# Patient Record
Sex: Female | Born: 1995 | Race: Black or African American | Hispanic: No | Marital: Single | Smoking: Never smoker
Health system: Southern US, Community
[De-identification: ages and names within clinical notes are randomized; demographics above are authoritative.]

## PROBLEM LIST (undated history)

## (undated) DIAGNOSIS — R634 Abnormal weight loss: Secondary | ICD-10-CM

## (undated) HISTORY — DX: Abnormal weight loss: R63.4

---
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
## 2018-07-29 ENCOUNTER — Other Ambulatory Visit: Payer: Self-pay

## 2018-07-29 ENCOUNTER — Ambulatory Visit: Payer: 59 | Attending: Family Medicine | Admitting: Physical Therapy

## 2018-07-29 ENCOUNTER — Encounter: Payer: Self-pay | Admitting: Physical Therapy

## 2018-07-29 DIAGNOSIS — G8929 Other chronic pain: Secondary | ICD-10-CM | POA: Diagnosis present

## 2018-07-29 DIAGNOSIS — M6281 Muscle weakness (generalized): Secondary | ICD-10-CM

## 2018-07-29 DIAGNOSIS — M25551 Pain in right hip: Secondary | ICD-10-CM | POA: Insufficient documentation

## 2018-07-29 DIAGNOSIS — M545 Low back pain: Secondary | ICD-10-CM | POA: Insufficient documentation

## 2018-07-29 DIAGNOSIS — M62830 Muscle spasm of back: Secondary | ICD-10-CM | POA: Diagnosis present

## 2018-07-29 NOTE — Therapy (Signed)
[facility] [address] [place], [place], [address] Phone: [phone]   Fax:  [phone]  Physical Therapy Evaluation  Patient Details  Name: Linda Craig MRN: [id number] Date of Birth: 1996-04-20 Referring Provider: Darrow Bussing, MD   Encounter Date: 07/29/2018  PT End of Session - 07/29/18 1029    Visit Number  1    Date for PT Re-Evaluation  09/28/18    Authorization Time Period  07/29/18 to 09/28/18    Authorization - Number of Visits  30    PT Start Time  0847    PT Stop Time  0929    PT Time Calculation (min)  42 min    Activity Tolerance  No increased pain;Patient tolerated treatment well    Behavior During Therapy  [facility] for tasks assessed/performed       Past Medical History:  Diagnosis Date  . Abdominal pain   . Weight loss     History reviewed. No pertinent surgical history.  There were no vitals filed for this visit.   Subjective Assessment - 07/29/18 0854    Subjective  Pt reports that she was doing a lift test back in January, and the following day her back locked up on her. She went to the ED and they diagnosed her with a back spasm and she tried to work through the pain. Again in June, she noticed a popping/pulling in her back and was sent to the urgernt care. From there, she went to PT about a month later for 2x/week for 3 weeks. She feels like it was helping but did start in alot of pain. She was told that her hip was out of alignment.     Limitations  Lifting    Patient Stated Goals  decrease pain in her back     Currently in Pain?  Yes    Pain Score  7     Pain Location  Back    Pain Orientation  Right;Posterior    Pain Descriptors / Indicators  Tightness    Pain Type  Acute pain;Chronic pain    Pain Radiating Towards  Rt posterior/lateral buttock     Pain Onset  More than a month ago    Pain Frequency  Constant    Aggravating Factors   laying on Rt side, any lifting     Pain  Relieving Factors  ice/estim helped temporarily    Multiple Pain Sites  No         [facility] PT Assessment - 07/29/18 0001      Assessment   Medical Diagnosis  chronic low back pain     Referring Provider  Dibas Koirala, MD    Onset Date/Surgical Date  --   End of January    Next MD Visit  08/04/18    Prior Therapy  2x/week for 3 weeks for low back       Precautions   Precautions  None      Restrictions   Weight Bearing Restrictions  No      Balance Screen   Has the patient fallen in the past 6 months  No    Has the patient had a decrease in activity level because of a fear of falling?   No    Is the patient reluctant to leave their home because of a fear of falling?   No      Prior Function   Vocation Requirements  currently MD has her out of work.  Alot of lifting, bending       Cognition   Overall Cognitive Status  Within Functional Limits for tasks assessed      Observation/Other Assessments   Focus on Therapeutic Outcomes (FOTO)   59% limited       Sensation   Additional Comments  Pt reports numbness and pain over Rt posterior/lateral hip       ROM / Strength   AROM / PROM / Strength  AROM;Strength      AROM   AROM Assessment Site  Lumbar    Lumbar Flexion  limited 50% painful stretch     Lumbar Extension  25% limited, pain end range   repeated extension in standing no change x10 reps    Lumbar - Right Rotation  50% limited, pain Rt low back     Lumbar - Left Rotation  25% limited, pain free       Strength   Strength Assessment Site  Hip;Knee    Right/Left Hip  Right;Left    Right Hip Flexion  5/5    Right Hip External Rotation   4/5    Right Hip Internal Rotation  4/5    Right Hip ABduction  3/5   painful   Left Hip Flexion  5/5    Left Hip Extension  5/5    Left Hip External Rotation  5/5    Left Hip Internal Rotation  5/5    Left Hip ABduction  4/5      Flexibility   Soft Tissue Assessment /Muscle Length  yes    Hamstrings  WNL    Quadriceps  WNL       Palpation   Palpation comment  tenderness along Rt glutes, Rt lumbar parspinals, Rt quadratus lumborum       Special Tests   Other special tests  (-) straight leg raise Lt/Rt                 Objective measurements completed on examination: See above findings.      [facility] Adult PT Treatment/Exercise - 07/29/18 0001      Self-Care   Self-Care  Heat/Ice Application    Heat/Ice Application  heat/ice parameters      Exercises   Exercises  Lumbar      Lumbar Exercises: Stretches   Piriformis Stretch  Right;1 rep;30 seconds    Other Lumbar Stretch Exercise  low trunk rotation x5 reps Lt and Rt     Other Lumbar Stretch Exercise  Rt glute stretch 2x15 sec hold       Lumbar Exercises: Supine   Other Supine Lumbar Exercises  Rt glute massage with ball, HEP demo             PT Education - 07/29/18 1028    Education Details  eval findings/POC; muscle spasm and importance of introducing movement; self glute massage at home    Person(s) Educated  Patient    Methods  Explanation;Verbal cues    Comprehension  Verbalized understanding;Returned demonstration       PT Short Term Goals - 07/29/18 1035      PT SHORT TERM GOAL #1   Title  Pt will demo consistency and independence with her initial HEP to improve ROM and decrease pain.     Time  4    Period  Weeks    Status  New    Target Date  08/29/18      PT SHORT TERM GOAL #2   Title  Pt  will demo consistency use and independent set up of lumbar roll to improve seated posture throughout the day.     Time  4    Period  Weeks    Status  New      PT SHORT TERM GOAL #3   Title  Pt will report atleast 30% improvement in her pain from the start of PT, to increase session participation.     Time  4    Period  Weeks    Status  New        PT Long Term Goals - 07/29/18 1037      PT LONG TERM GOAL #1   Title  Pt will demo improved Rt hip strength to 5/5 MMT which will increase her ability to use proper mechanics  while lifting.     Time  8    Period  Weeks    Status  New    Target Date  09/28/18      PT LONG TERM GOAL #2   Title  Pt will be able to lift a 10# box from the floor 3/5 trials without the need for cues for proper technique.     Time  8    Period  Weeks    Status  New      PT LONG TERM GOAL #3   Title  Pt will report atleast 75% improvement in her low back/buttock pain from the start of PT, to allow her to resume regular work activity with activity.     Time  8    Period  Weeks    Status  New      PT LONG TERM GOAL #4   Title  Pt will be able to complete active lumbar ROM with no more than 2/10 discomfort to improve her participation in daily activity.     Time  8    Period  Weeks    Status  New             Plan - 07/29/18 1029    Clinical Impression Statement  Pt is a 22 y.o F referred to OPPT with complaints of Rt low back and lateral hip pain. Her low back pain was onset back in January following a lifting test at work, since then her pain has fluctuated and was worsened again with activity in June. She recently noted Rt lateral hip/buttock pain especially with laying on her Rt side. She demonstrates pain and limitation in lumbar active ROM, as well as limited Rt hip strength and tenderness along the gluteals. She is currently out of work and has been trying to complete stretches provided by her previous PT. She would benefit from skilled PT to address her muscle spasm and improve her lumbar ROM, hip strength and trunk strength/stability as well as education on proper lifting mechanics to facilitate her return to work without limitation.     Clinical Presentation  Stable    Clinical Presentation due to:  somewhat improved since january but recently exacerbated with lifitng in june    Clinical Decision Making  Low    Rehab Potential  Good    PT Frequency  2x / week    PT Duration  8 weeks    PT Treatment/Interventions  ADLs/Self Care Home Management;Moist Heat;Electrical  Stimulation;Cryotherapy;Therapeutic exercise;Patient/family education;Passive range of motion;Manual techniques;Therapeutic activities;Neuromuscular re-education;Dry needling;Taping    PT Next Visit Plan  f/u on prone pressups; introduce trunk strengthening; soft tissue techniques and possible D/N to lumbar and glutes;  promote gentle lumbar movement     PT Home Exercise Plan  Access Code: [email or web address]     Consulted and Agree with Plan of Care  Patient       Patient will benefit from skilled therapeutic intervention in order to improve the following deficits and impairments:  Decreased activity tolerance, Decreased strength, Impaired flexibility, Postural dysfunction, Pain, Improper body mechanics, Decreased range of motion, Increased muscle spasms  Visit Diagnosis: Chronic right-sided low back pain, with sciatica presence unspecified  Pain in right hip  Muscle spasm of back  Muscle weakness (generalized)     Problem List Patient Active Problem List   Diagnosis Date Noted  . Decrease in appetite 07/05/2011  . Epigastric abdominal pain   . Weight loss    10:43 AM,07/29/18 Donita Brooks PT, DPT [facility]  [phone]   [facility] [address] [place], [place], [address] Phone: [phone]   Fax:  [phone]  Name: Linda Craig MRN: [id number] Date of Birth: 1996/11/12

## 2018-07-29 NOTE — Patient Instructions (Signed)
Access Code: [email or web address]  URL: [email or web address]  Date: 07/29/2018  Prepared by: Marylyn IshiharaSara Kiser   Exercises  Supine Gluteus Stretch - 3 reps - 2 sets - 30 hold - 1x daily - 7x weekly  Supine Figure 4 Piriformis Stretch - 3 reps - 30 hold - 1x daily - 7x weekly  Supine Lower Trunk Rotation - 10 reps - 2 sets - 1x daily - 7x weekly    [facility] [address], [address] [place], [place] [address] Phone # [phone] Fax [phone]

## 2018-08-11 ENCOUNTER — Ambulatory Visit: Payer: 59 | Attending: Family Medicine | Admitting: Physical Therapy

## 2018-08-11 ENCOUNTER — Encounter: Payer: Self-pay | Admitting: Physical Therapy

## 2018-08-11 DIAGNOSIS — G8929 Other chronic pain: Secondary | ICD-10-CM | POA: Diagnosis present

## 2018-08-11 DIAGNOSIS — M25551 Pain in right hip: Secondary | ICD-10-CM | POA: Diagnosis present

## 2018-08-11 DIAGNOSIS — M6281 Muscle weakness (generalized): Secondary | ICD-10-CM

## 2018-08-11 DIAGNOSIS — M62830 Muscle spasm of back: Secondary | ICD-10-CM | POA: Insufficient documentation

## 2018-08-11 DIAGNOSIS — M545 Low back pain: Secondary | ICD-10-CM | POA: Diagnosis not present

## 2018-08-11 NOTE — Therapy (Signed)
[facility] [address], [address] [place], [place], [address] Phone: [phone]   Fax:  [phone]  Physical Therapy Treatment  Patient Details  Name: Linda Craig MRN: [id number] Date of Birth: June 24, 1996 Referring Provider: Darrow Bussing, MD   Encounter Date: 08/11/2018  PT End of Session - 08/11/18 1518    Visit Number  2    Date for PT Re-Evaluation  09/28/18    Authorization Time Period  07/29/18 to 09/28/18    Authorization - Number of Visits  30    PT Start Time  1015    PT Stop Time  1105    PT Time Calculation (min)  50 min    Activity Tolerance  No increased pain;Patient tolerated treatment well       Past Medical History:  Diagnosis Date  . Abdominal pain   . Weight loss     History reviewed. No pertinent surgical history.  There were no vitals filed for this visit.  Subjective Assessment - 08/11/18 1017    Subjective  Back pain 8/10.  Always more in the morning.  Right LBP to right lateral hip.  Hurts with lying down.  Just got back from [facility].      Currently in Pain?  Yes    Pain Score  8     Pain Location  Back    Pain Orientation  Right    Pain Type  Chronic pain    Aggravating Factors   mornings;  lying on right side;  prolonged walking     Pain Relieving Factors  ice                        [facility] Adult PT Treatment/Exercise - 08/11/18 0001      Lumbar Exercises: Supine   Ab Set  10 reps    Other Supine Lumbar Exercises  review of initial HEP      Lumbar Exercises: Prone   Other Prone Lumbar Exercises  press ups 10x      Moist Heat Therapy   Number Minutes Moist Heat  13 Minutes    Moist Heat Location  Lumbar Spine;Hip      Electrical Stimulation   Electrical Stimulation Location  lumbar hip    Electrical Stimulation Action  IFC    Electrical Stimulation Parameters  7 ma 13 min prone    Electrical Stimulation Goals  Pain      Manual Therapy   Manual  Therapy  Joint mobilization;Soft tissue mobilization    Joint Mobilization  pelvic distraction/ neutral gapping    Soft tissue mobilization  lumbar paraspinals, QL, gluteals        Trigger Point Dry Needling - 08/11/18 1517    Consent Given?  Yes    Education Handout Provided  Yes    Muscles Treated Lower Body  Gluteus minimus;Gluteus maximus   bil lumbar multifidi   Gluteus Maximus Response  Palpable increased muscle length           PT Education - 08/11/18 1518    Education Details  supine abdominal brace;  home TENs info;  DN after care    Person(s) Educated  Patient    Methods  Explanation;Handout;Demonstration    Comprehension  Returned demonstration;Verbalized understanding       PT Short Term Goals - 07/29/18 1035      PT SHORT TERM GOAL #1   Title  Pt will demo consistency and independence with her  initial HEP to improve ROM and decrease pain.     Time  4    Period  Weeks    Status  New    Target Date  08/29/18      PT SHORT TERM GOAL #2   Title  Pt will demo consistency use and independent set up of lumbar roll to improve seated posture throughout the day.     Time  4    Period  Weeks    Status  New      PT SHORT TERM GOAL #3   Title  Pt will report atleast 30% improvement in her pain from the start of PT, to increase session participation.     Time  4    Period  Weeks    Status  New        PT Long Term Goals - 07/29/18 1037      PT LONG TERM GOAL #1   Title  Pt will demo improved Rt hip strength to 5/5 MMT which will increase her ability to use proper mechanics while lifting.     Time  8    Period  Weeks    Status  New    Target Date  09/28/18      PT LONG TERM GOAL #2   Title  Pt will be able to lift a 10# box from the floor 3/5 trials without the need for cues for proper technique.     Time  8    Period  Weeks    Status  New      PT LONG TERM GOAL #3   Title  Pt will report atleast 75% improvement in her low back/buttock pain from the  start of PT, to allow her to resume regular work activity with activity.     Time  8    Period  Weeks    Status  New      PT LONG TERM GOAL #4   Title  Pt will be able to complete active lumbar ROM with no more than 2/10 discomfort to improve her participation in daily activity.     Time  8    Period  Weeks    Status  New            Plan - 08/11/18 1055    Clinical Impression Statement  The patient demonstrates full lumbar extension with press ups and is generally hypermobile in most other joints.  She has tender points in right lumbar musculature and right gluteals.  She is receptive to manual therapy and initiating dry needling.  Improved soft tissue mobility noted following.  Good response to ES/heat.       Rehab Potential  Good    PT Frequency  2x / week    PT Duration  8 weeks    PT Treatment/Interventions  ADLs/Self Care Home Management;Moist Heat;Electrical Stimulation;Cryotherapy;Therapeutic exercise;Patient/family education;Passive range of motion;Manual techniques;Therapeutic activities;Neuromuscular re-education;Dry needling;Taping    PT Next Visit Plan  assess response to DN #1;  STW lumbar and right hip;  progress abdominal brace series;  add bird dogs;  ES/heat as needed    PT Home Exercise Plan  Access Code: [email or web address]        Patient will benefit from skilled therapeutic intervention in order to improve the following deficits and impairments:  Decreased activity tolerance, Decreased strength, Impaired flexibility, Postural dysfunction, Pain, Improper body mechanics, Decreased range of motion, Increased muscle spasms  Visit Diagnosis: Chronic right-sided low back pain, with  sciatica presence unspecified  Pain in right hip  Muscle spasm of back  Muscle weakness (generalized)     Problem List Patient Active Problem List   Diagnosis Date Noted  . Decrease in appetite 07/05/2011  . Epigastric abdominal pain   . Weight loss    Lavinia Sharps, PT 08/11/18 3:23  PM Phone: [phone] Fax: [phone]  [name] 08/11/2018, 3:23 PM  [facility] [address], [address] [place], [place], [address] Phone: [phone]   Fax:  [phone]  Name: Jaiana Waage MRN: [id number] Date of Birth: August 31, 1996

## 2018-08-11 NOTE — Patient Instructions (Addendum)
TENS UNIT  This is helpful for muscle pain and spasm.   Search and Purchase a TENS 7000 2nd edition at www.tenspros.com or www.amazon.com  (It should be less than $30)     TENS unit instructions:   Do not shower or bathe with the unit on  Turn the unit off before removing electrodes or batteries  If the electrodes lose stickiness add a drop of water to the electrodes after they are disconnected from the unit and place on plastic sheet. If you continued to have difficulty, call the TENS unit company to purchase more electrodes.  Do not apply lotion on the skin area prior to use. Make sure the skin is clean and dry as this will help prolong the life of the electrodes.  After use, always check skin for unusual red areas, rash or other skin difficulties. If there are any skin problems, does not apply electrodes to the same area.  Never remove the electrodes from the unit by pulling the wires.  Do not use the TENS unit or electrodes other than as directed.  Do not change electrode placement without consulting your therapist or physician.  Keep 2 fingers with between each electrode.   Trigger Point Dry Needling  . What is Trigger Point Dry Needling (DN)? o DN is a physical therapy technique used to treat muscle pain and dysfunction. Specifically, DN helps deactivate muscle trigger points (muscle knots).  o A thin filiform needle is used to penetrate the skin and stimulate the underlying trigger point. The goal is for a local twitch response (LTR) to occur and for the trigger point to relax. No medication of any kind is injected during the procedure.   . What Does Trigger Point Dry Needling Feel Like?  o The procedure feels different for each individual patient. Some patients report that they do not actually feel the needle enter the skin and overall the process is not painful. Very mild bleeding may occur. However, many patients feel a deep cramping in the muscle in which the needle was  inserted. This is the local twitch response.   Marland Kitchen How Will I feel after the treatment? o Soreness is normal, and the onset of soreness may not occur for a few hours. Typically this soreness does not last longer than two days.  o Bruising is uncommon, however; ice can be used to decrease any possible bruising.  o In rare cases feeling tired or nauseous after the treatment is normal. In addition, your symptoms may get worse before they get better, this period will typically not last longer than 24 hours.   . What Can I do After My Treatment? o Increase your hydration by drinking more water for the next 24 hours. o You may place ice or heat on the areas treated that have become sore, however, do not use heat on inflamed or bruised areas. Heat often brings more relief post needling. o You can continue your regular activities, but vigorous activity is not recommended initially after the treatment for 24 hours. o DN is best combined with other physical therapy such as strengthening, stretching, and other therapies.    Lavinia Sharps PT [facility] [address], [address] [place], [place] [address] Phone # [phone] Fax [phone]

## 2018-08-13 ENCOUNTER — Ambulatory Visit: Payer: 59 | Admitting: Physical Therapy

## 2018-08-13 ENCOUNTER — Encounter: Payer: Self-pay | Admitting: Physical Therapy

## 2018-08-13 DIAGNOSIS — M25551 Pain in right hip: Secondary | ICD-10-CM

## 2018-08-13 DIAGNOSIS — M62830 Muscle spasm of back: Secondary | ICD-10-CM

## 2018-08-13 DIAGNOSIS — G8929 Other chronic pain: Secondary | ICD-10-CM

## 2018-08-13 DIAGNOSIS — M545 Low back pain: Principal | ICD-10-CM

## 2018-08-13 DIAGNOSIS — M6281 Muscle weakness (generalized): Secondary | ICD-10-CM

## 2018-08-13 NOTE — Patient Instructions (Addendum)
     Access Code: [email or web address]  URL: [email or web address]  Date: 08/13/2018  Prepared by: Lavinia SharpsStacy Sully Manzi   Exercises  Supine Gluteus Stretch - 3 reps - 2 sets - 30 hold - 1x daily - 7x weekly  Supine Figure 4 Piriformis Stretch - 3 reps - 30 hold - 1x daily - 7x weekly  Supine Lower Trunk Rotation - 10 reps - 2 sets - 1x daily - 7x weekly  Supine Transversus Abdominis Bracing - Hands on Stomach - 10 reps - 1 sets - 1x daily - 7x weekly  Hooklying Isometric Clamshell - 10 reps - 2 sets - 1x daily - 7x weekly  Hooklying Isometric Hip Flexion - 10 reps - 1 sets - 1x daily - 7x weekly  Bird Dog - 10 reps - 1 sets - 1x daily - 7x weekly  Cat Cow - 10 reps - 1 sets - 1x daily - 7x weekly     Lavinia SharpsStacy Kerilyn Cortner PT [facility] [address], [address] [place], [place] [address] Phone # [phone] Fax [phone]

## 2018-08-13 NOTE — Therapy (Signed)
[facility] [address] [place], [place], [address] Phone: [phone]   Fax:  [phone]  Physical Therapy Treatment  Patient Details  Name: Linda Craig MRN: [id number] Date of Birth: 30-Apr-1996 Referring Provider: Darrow Bussing, MD   Encounter Date: 08/13/2018  PT End of Session - 08/13/18 1922    Visit Number  3    Date for PT Re-Evaluation  09/28/18    Authorization Time Period  07/29/18 to 09/28/18    PT Start Time  0930    PT Stop Time  1015    PT Time Calculation (min)  45 min    Activity Tolerance  Patient tolerated treatment well       Past Medical History:  Diagnosis Date  . Abdominal pain   . Weight loss     History reviewed. No pertinent surgical history.  There were no vitals filed for this visit.  Subjective Assessment - 08/13/18 0929    Subjective  I'm sore from the DN.  7/10 this morning.      Currently in Pain?  Yes    Pain Score  7     Pain Location  Back    Pain Orientation  Lower    Pain Type  Chronic pain                       [facility] Adult PT Treatment/Exercise - 08/13/18 0001      Lumbar Exercises: Aerobic   UBE (Upper Arm Bike)  sitting on green ball 1 1/2 forward, 1 1/2 backward      Lumbar Exercises: Seated   Other Seated Lumbar Exercises  pelvic rocks sitting on ball 10x      Lumbar Exercises: Supine   Ab Set  10 reps    Clam  10 reps    Bent Knee Raise Limitations  attempted but discontinued secondary to pain     Isometric Hip Flexion  10 reps      Lumbar Exercises: Quadruped   Single Arm Raise  Right;Left;5 reps    Straight Leg Raise  5 reps    Opposite Arm/Leg Raise  Right arm/Left leg;Left arm/Right leg;5 reps      Manual Therapy   Kinesiotex  Facilitate Muscle      Kinesiotix   Facilitate Muscle   horizontal across low back, 2 strips hip             PT Education - 08/13/18 1008    Education Details   Access Code: [email or web address]   cat/cow;  supine clams;  bird dogs    Person(s) Educated  Patient    Methods  Explanation;Demonstration;Handout    Comprehension  Returned demonstration;Verbalized understanding       PT Short Term Goals - 07/29/18 1035      PT SHORT TERM GOAL #1   Title  Pt will demo consistency and independence with her initial HEP to improve ROM and decrease pain.     Time  4    Period  Weeks    Status  New    Target Date  08/29/18      PT SHORT TERM GOAL #2   Title  Pt will demo consistency use and independent set up of lumbar roll to improve seated posture throughout the day.     Time  4    Period  Weeks    Status  New      PT SHORT TERM GOAL #3  Title  Pt will report atleast 30% improvement in her pain from the start of PT, to increase session participation.     Time  4    Period  Weeks    Status  New        PT Long Term Goals - 07/29/18 1037      PT LONG TERM GOAL #1   Title  Pt will demo improved Rt hip strength to 5/5 MMT which will increase her ability to use proper mechanics while lifting.     Time  8    Period  Weeks    Status  New    Target Date  09/28/18      PT LONG TERM GOAL #2   Title  Pt will be able to lift a 10# box from the floor 3/5 trials without the need for cues for proper technique.     Time  8    Period  Weeks    Status  New      PT LONG TERM GOAL #3   Title  Pt will report atleast 75% improvement in her low back/buttock pain from the start of PT, to allow her to resume regular work activity with activity.     Time  8    Period  Weeks    Status  New      PT LONG TERM GOAL #4   Title  Pt will be able to complete active lumbar ROM with no more than 2/10 discomfort to improve her participation in daily activity.     Time  8    Period  Weeks    Status  New            Plan - 08/13/18 1003    Clinical Impression Statement  The patient reports moderate soreness today but is able to perform low to moderate intensity core stabilization and  strengthening with some modifications secondary to increased pain.  Increased soreness may be attributed to dry needling last visit and should subside in the next day or two.  Verbal and tactile cues to activate transverse abdominus muscles and to avoid pelvic drop in quadruped position.  Continue with treatment plan for pain relieving modalities, manual therapy and exercise.        Rehab Potential  Good    PT Frequency  2x / week    PT Duration  8 weeks    PT Treatment/Interventions  ADLs/Self Care Home Management;Moist Heat;Electrical Stimulation;Cryotherapy;Therapeutic exercise;Patient/family education;Passive range of motion;Manual techniques;Therapeutic activities;Neuromuscular re-education;Dry needling;Taping    PT Next Visit Plan  assess response to KT;  DN #2 if patient found beneficial;    STW lumbar and right hip;  progress abdominal brace series;  add bird dogs;  ES/heat as needed    PT Home Exercise Plan  Access Code: [email or web address]        Patient will benefit from skilled therapeutic intervention in order to improve the following deficits and impairments:  Decreased activity tolerance, Decreased strength, Impaired flexibility, Postural dysfunction, Pain, Improper body mechanics, Decreased range of motion, Increased muscle spasms  Visit Diagnosis: Chronic right-sided low back pain, with sciatica presence unspecified  Pain in right hip  Muscle spasm of back  Muscle weakness (generalized)     Problem List Patient Active Problem List   Diagnosis Date Noted  . Decrease in appetite 07/05/2011  . Epigastric abdominal pain   . Weight loss    Lavinia SharpsStacy Alok Minshall, PT 08/13/18 7:32 PM Phone: [phone] Fax: [phone]  Lavinia SharpsSimpson, Syrita Dovel  C 08/13/2018, 7:32 PM  [facility] [address], [address] [place], [place], [address] Phone: [phone]   Fax:  [phone]  Name: Linda Craig MRN: [id number] Date of Birth: Jan 15, 1996

## 2018-08-18 ENCOUNTER — Encounter: Payer: Self-pay | Admitting: Physical Therapy

## 2018-08-18 ENCOUNTER — Ambulatory Visit: Payer: 59 | Admitting: Physical Therapy

## 2018-08-18 DIAGNOSIS — M25551 Pain in right hip: Secondary | ICD-10-CM

## 2018-08-18 DIAGNOSIS — M545 Low back pain: Principal | ICD-10-CM

## 2018-08-18 DIAGNOSIS — G8929 Other chronic pain: Secondary | ICD-10-CM

## 2018-08-18 DIAGNOSIS — M6281 Muscle weakness (generalized): Secondary | ICD-10-CM

## 2018-08-18 DIAGNOSIS — M62830 Muscle spasm of back: Secondary | ICD-10-CM

## 2018-08-18 NOTE — Therapy (Signed)
[facility] [address] [place], [place], [address] Phone: [phone]   Fax:  [phone]  Physical Therapy Treatment  Patient Details  Name: Linda SpanDareyon Vanwieren MRN: [id number] Date of Birth: 1996/01/05 Referring Provider: Darrow Bussingibas Koirala, MD   Encounter Date: 08/18/2018  PT End of Session - 08/18/18 1923    Visit Number  4    Date for PT Re-Evaluation  09/28/18    Authorization Time Period  07/29/18 to 09/28/18    PT Start Time  0933    PT Stop Time  1020    PT Time Calculation (min)  47 min    Activity Tolerance  Patient tolerated treatment well       Past Medical History:  Diagnosis Date  . Abdominal pain   . Weight loss     History reviewed. No pertinent surgical history.  There were no vitals filed for this visit.  Subjective Assessment - 08/18/18 0938    Subjective  The doctor wrote me out of work for another week.  Going to see a back specialist.  The pain was sharp yesterday.      Currently in Pain?  Yes    Pain Score  7     Pain Location  Back    Pain Orientation  Lower;Right    Pain Type  Chronic pain    Pain Radiating Towards  right buttock/lateral     Pain Frequency  Constant    Aggravating Factors   mornings    Pain Relieving Factors  tape                       [facility] Adult PT Treatment/Exercise - 08/18/18 0001      Lumbar Exercises: Aerobic   UBE (Upper Arm Bike)  sitting on green ball 1 1/2 forward, 1 1/2 backward      Lumbar Exercises: Supine   Other Supine Lumbar Exercises  decompression series 5x  each:  head press, shoulder press, palm press, leg lengthener, whole leg press down.  deep breathing with palms up       Moist Heat Therapy   Number Minutes Moist Heat  13 Minutes    Moist Heat Location  Lumbar Spine;Hip      Electrical Stimulation   Electrical Stimulation Location  lumbar hip    Electrical Stimulation Action  IFC    Electrical Stimulation Parameters  7  ma 13 min prone    Electrical Stimulation Goals  Pain      Manual Therapy   Manual therapy comments  piriformis contract relax 3x 5 sec hold    Joint Mobilization  right hip distraction, inferior, AP in internal rotation grade 3 3x 30 sec    Kinesiotex  Facilitate Muscle      Kinesiotix   Facilitate Muscle   horizontal across low back, 2 strips hip             PT Education - 08/18/18 0954    Education Details  decompression series     Person(s) Educated  Patient    Methods  Explanation;Handout    Comprehension  Returned demonstration;Verbalized understanding       PT Short Term Goals - 07/29/18 1035      PT SHORT TERM GOAL #1   Title  Pt will demo consistency and independence with her initial HEP to improve ROM and decrease pain.     Time  4    Period  Weeks  Status  New    Target Date  08/29/18      PT SHORT TERM GOAL #2   Title  Pt will demo consistency use and independent set up of lumbar roll to improve seated posture throughout the day.     Time  4    Period  Weeks    Status  New      PT SHORT TERM GOAL #3   Title  Pt will report atleast 30% improvement in her pain from the start of PT, to increase session participation.     Time  4    Period  Weeks    Status  New        PT Long Term Goals - 07/29/18 1037      PT LONG TERM GOAL #1   Title  Pt will demo improved Rt hip strength to 5/5 MMT which will increase her ability to use proper mechanics while lifting.     Time  8    Period  Weeks    Status  New    Target Date  09/28/18      PT LONG TERM GOAL #2   Title  Pt will be able to lift a 10# box from the floor 3/5 trials without the need for cues for proper technique.     Time  8    Period  Weeks    Status  New      PT LONG TERM GOAL #3   Title  Pt will report atleast 75% improvement in her low back/buttock pain from the start of PT, to allow her to resume regular work activity with activity.     Time  8    Period  Weeks    Status  New       PT LONG TERM GOAL #4   Title  Pt will be able to complete active lumbar ROM with no more than 2/10 discomfort to improve her participation in daily activity.     Time  8    Period  Weeks    Status  New            Plan - 08/18/18 1924    Clinical Impression Statement  The patient reports continued soreness although it is unclear if this is attributed to the dry needling or the same type pain she has had the last few months.  She is able to perform low level supine decompressive ex's with minimal pain behaviors.  Good response to kinesiotaping and modalities.  Therapist closely monitoring response and modifying treatment accordingly.      Rehab Potential  Good    PT Frequency  2x / week    PT Duration  8 weeks    PT Treatment/Interventions  ADLs/Self Care Home Management;Moist Heat;Electrical Stimulation;Cryotherapy;Therapeutic exercise;Patient/family education;Passive range of motion;Manual techniques;Therapeutic activities;Neuromuscular re-education;Dry needling;Taping    PT Next Visit Plan  assess response to decompressive ex and hip joint mobs;   KT;  DN #2 if patient found beneficial;    STW lumbar and right hip;  progress abdominal brace series;  add bird dogs;  ES/heat as needed       Patient will benefit from skilled therapeutic intervention in order to improve the following deficits and impairments:  Decreased activity tolerance, Decreased strength, Impaired flexibility, Postural dysfunction, Pain, Improper body mechanics, Decreased range of motion, Increased muscle spasms  Visit Diagnosis: Chronic right-sided low back pain, with sciatica presence unspecified  Pain in right hip  Muscle spasm of back  Muscle weakness (generalized)     Problem List Patient Active Problem List   Diagnosis Date Noted  . Decrease in appetite 07/05/2011  . Epigastric abdominal pain   . Weight loss    Lavinia Sharps, PT 08/18/18 7:33 PM Phone: [phone] Fax: [phone]  [name] 08/18/2018, 7:32 PM  [facility] [address] [place], [place], [address] Phone: [phone]   Fax:  [phone]  Name: Amaree Loisel MRN: [id number] Date of Birth: 09-15-1996

## 2018-08-18 NOTE — Patient Instructions (Signed)
   RE-ALIGNMENT ROUTINE EXERCISES Decompression BASIC FOR POSTURAL CORRECTION   RE-ALIGNMENT Tips BENEFITS: 1.It helps to re-align the curves of the back and improve standing posture. 2.It allows the back muscles to rest and strengthen in preparation for more activity. FREQUENCY: Daily, even after weeks, months and years of more advanced exercises. START: 1.All exercises start in the same position: lying on the back, arms resting on the supporting surface, palms up and slightly away from the body, backs of hands down, knees bent, feet flat. 2.The head, neck, arms, and legs are supported according to specific instructions of your therapist. Copyright  VHI. All rights reserved.    1. Decompression Exercise: Basic.   Takes compression off the vertebral bodies; increases tolerance for lying on the back; helps relieve back pain   Lie on back on firm surface, knees bent, feet flat, arms turned up, out to sides (~35 degrees). Head neck and arms supported as necessary. Time _5-15__ minutes. Surface: floor     2. Shoulder Press  Strengthens upper back extensors and scapular retractors.   Press both shoulders down. Hold _2-3__ seconds. Repeat _3-5__ times. Surface: floor        3. Head Press With Chin Tuck  Strengthens neck extensors   Tuck chin SLIGHTLY toward chest, keep mouth closed. Feel weight on back of head. Increase weight by pressing head down. Hold _2-3__ seconds. Relax. Repeat 3-5___ times. Surface: floor   4. Leg Lengthener: Keep one knee bent and the other leg extended.   "Grow your leg longer"   One leg at a time Hold 5 sec 5x   5. Whole leg press "down into the sand. " Other leg is bent.       Lavinia SharpsStacy Tavone Caesar PT [facility] [address], [address] [place], [place] [address] Phone # [phone] Fax [phone]

## 2018-08-20 ENCOUNTER — Encounter: Payer: Self-pay | Admitting: Physical Therapy

## 2018-08-20 ENCOUNTER — Ambulatory Visit: Payer: 59 | Admitting: Physical Therapy

## 2018-08-20 DIAGNOSIS — G8929 Other chronic pain: Secondary | ICD-10-CM

## 2018-08-20 DIAGNOSIS — M545 Low back pain: Secondary | ICD-10-CM | POA: Diagnosis not present

## 2018-08-20 DIAGNOSIS — M6281 Muscle weakness (generalized): Secondary | ICD-10-CM

## 2018-08-20 DIAGNOSIS — M25551 Pain in right hip: Secondary | ICD-10-CM

## 2018-08-20 DIAGNOSIS — M62830 Muscle spasm of back: Secondary | ICD-10-CM

## 2018-08-20 NOTE — Therapy (Signed)
[facility] [address], [address] [place], [place], [address] Phone: [phone]   Fax:  [phone]  Physical Therapy Treatment  Patient Details  Name: Linda Craig MRN: [id number] Date of Birth: Jul 03, 1996 Referring Provider: Darrow Bussing, MD   Encounter Date: 08/20/2018  PT End of Session - 08/20/18 1029    Visit Number  5    Date for PT Re-Evaluation  09/28/18    Authorization Time Period  07/29/18 to 09/28/18    PT Start Time  1015    PT Stop Time  1100    PT Time Calculation (min)  45 min    Activity Tolerance  Patient tolerated treatment well       Past Medical History:  Diagnosis Date  . Abdominal pain   . Weight loss     History reviewed. No pertinent surgical history.  There were no vitals filed for this visit.  Subjective Assessment - 08/20/18 1015    Subjective  Feeling OK.  Nothing new.  My pain is not so bad today.  My abdominal muscles are sore.      Patient Stated Goals  decrease pain in her back     Currently in Pain?  Yes    Pain Score  6     Pain Location  Back    Pain Orientation  Right;Lower    Pain Type  Chronic pain    Pain Onset  More than a month ago    Pain Frequency  Constant    Aggravating Factors   mornings    Pain Relieving Factors  tape                       [facility] Adult PT Treatment/Exercise - 08/20/18 0001      Lumbar Exercises: Aerobic   UBE (Upper Arm Bike)  sitting on green ball 2 1/2 forward, 2 1/2 back      Lumbar Exercises: Supine   Other Supine Lumbar Exercises  decompression series 5x  each:  head press, shoulder press, palm press, leg lengthener, whole leg press down.  deep breathing with palms up       Moist Heat Therapy   Number Minutes Moist Heat  13 Minutes    Moist Heat Location  Lumbar Spine;Hip      Electrical Stimulation   Electrical Stimulation Location  lumbar hip    Electrical Stimulation Action  IFC    Electrical Stimulation  Parameters  7 ma 13 min prone    Electrical Stimulation Goals  Pain      Manual Therapy   Joint Mobilization    Soft tissue mobilization  lumbar paraspinals, QL, gluteals, piriformis    Kinesiotex  Facilitate Muscle   star pattern      Trigger Point Dry Needling - 08/20/18 1019    Consent Given?  Yes    Muscles Treated Lower Body  Piriformis   bil lumbar multifidi    Gluteus Maximus Response  Twitch response elicited;Palpable increased muscle length    Piriformis Response  Twitch response elicited;Palpable increased muscle length             PT Short Term Goals - 07/29/18 1035      PT SHORT TERM GOAL #1   Title  Pt will demo consistency and independence with her initial HEP to improve ROM and decrease pain.     Time  4    Period  Weeks    Status  New  Target Date  08/29/18      PT SHORT TERM GOAL #2   Title  Pt will demo consistency use and independent set up of lumbar roll to improve seated posture throughout the day.     Time  4    Period  Weeks    Status  New      PT SHORT TERM GOAL #3   Title  Pt will report atleast 30% improvement in her pain from the start of PT, to increase session participation.     Time  4    Period  Weeks    Status  New        PT Long Term Goals - 07/29/18 1037      PT LONG TERM GOAL #1   Title  Pt will demo improved Rt hip strength to 5/5 MMT which will increase her ability to use proper mechanics while lifting.     Time  8    Period  Weeks    Status  New    Target Date  09/28/18      PT LONG TERM GOAL #2   Title  Pt will be able to lift a 10# box from the floor 3/5 trials without the need for cues for proper technique.     Time  8    Period  Weeks    Status  New      PT LONG TERM GOAL #3   Title  Pt will report atleast 75% improvement in her low back/buttock pain from the start of PT, to allow her to resume regular work activity with activity.     Time  8    Period  Weeks    Status  New      PT LONG TERM GOAL #4    Title  Pt will be able to complete active lumbar ROM with no more than 2/10 discomfort to improve her participation in daily activity.     Time  8    Period  Weeks    Status  New            Plan - 08/20/18 1054    Clinical Impression Statement  The patient has fewer tender points in gluteals and piriformis compared to previous sessions.  Tender points mainly in right lumbar paraspinals.   She has difficulty performing even very low level exercises with reports of 6/10 pain and sometimes "sharper'".   She does move well with getting on/off treatment tables.   DN seems less painful today and improved soft tissue mobility noted following DN and manual therapy.  She likes the support given by the kinesiotape.  Therapist closely monitoring response to all treatment interventions.      Rehab Potential  Good    PT Frequency  2x / week    PT Duration  8 weeks    PT Treatment/Interventions  ADLs/Self Care Home Management;Moist Heat;Electrical Stimulation;Cryotherapy;Therapeutic exercise;Patient/family education;Passive range of motion;Manual techniques;Therapeutic activities;Neuromuscular re-education;Dry needling;Taping    PT Next Visit Plan  assess response to decompressive ex and hip joint mobs;   star pattern KT;  DN #3 if patient found beneficial; may add ES to DN;     STW lumbar and right hip;  progress abdominal brace series;  bird dogs;  ES/heat as needed    PT Home Exercise Plan  Access Code: [email or web address]        Patient will benefit from skilled therapeutic intervention in order to improve the following deficits and impairments:  Decreased  activity tolerance, Decreased strength, Impaired flexibility, Postural dysfunction, Pain, Improper body mechanics, Decreased range of motion, Increased muscle spasms  Visit Diagnosis: Chronic right-sided low back pain, with sciatica presence unspecified  Pain in right hip  Muscle spasm of back  Muscle weakness (generalized)     Problem  List Patient Active Problem List   Diagnosis Date Noted  . Decrease in appetite 07/05/2011  . Epigastric abdominal pain   . Weight loss    Lavinia Sharps, PT 08/20/18 12:02 PM Phone: [phone] Fax: [phone] [name] 08/20/2018, 12:02 PM  [facility] [address] [place], [place], [address] Phone: [phone]   Fax:  [phone]  Name: Linda Craig MRN: [id number] Date of Birth: 12-Nov-1996

## 2018-08-25 ENCOUNTER — Encounter: Payer: 59 | Admitting: Physical Therapy

## 2018-08-27 ENCOUNTER — Ambulatory Visit: Payer: 59 | Admitting: Physical Therapy

## 2018-09-01 ENCOUNTER — Ambulatory Visit: Payer: 59 | Attending: Family Medicine | Admitting: Physical Therapy

## 2018-09-01 DIAGNOSIS — M6281 Muscle weakness (generalized): Secondary | ICD-10-CM | POA: Insufficient documentation

## 2018-09-01 DIAGNOSIS — M25551 Pain in right hip: Secondary | ICD-10-CM | POA: Insufficient documentation

## 2018-09-01 DIAGNOSIS — M62830 Muscle spasm of back: Secondary | ICD-10-CM | POA: Diagnosis present

## 2018-09-01 NOTE — Therapy (Signed)
[facility] [address], [place], [place], [address] Phone: [phone]   Fax:  [phone]  Physical Therapy Treatment  Patient Details  Name: Linda Craig MRN: [id number] Date of Birth: 15-Mar-1996 Referring Provider (PT): Dibas Dorthy Cooler, MD   Encounter Date: 09/01/2018  PT End of Session - 09/01/18 1046    Visit Number  6    Date for PT Re-Evaluation  09/28/18    Authorization Time Period  07/29/18 to 09/28/18    Authorization - Number of Visits  30    PT Start Time  1014    PT Stop Time  1100    PT Time Calculation (min)  46 min    Activity Tolerance  Patient tolerated treatment well       Past Medical History:  Diagnosis Date  . Abdominal pain   . Weight loss     No past surgical history on file.  There were no vitals filed for this visit.  Subjective Assessment - 09/01/18 1014    Subjective  Saw the back specialist on Friday.  Going for an MRI and then ESI.  Not scheduled yet.  She reports she is very needle-phobic.    Lower back pain.  I was sore after DN last time but over it now.  60-70% better.      Limitations  Lifting    Patient Stated Goals  decrease pain in her back     Currently in Pain?  Yes    Pain Score  6     Pain Location  Back    Pain Orientation  Lower    Pain Type  Chronic pain    Aggravating Factors   right sidelying;  sitting erect; standing for a long time    Pain Relieving Factors  kinesiotape; IFC/heat; lying on my back;  walking OK for short distances to mailbox                       [facility] Adult PT Treatment/Exercise - 09/01/18 0001      Lumbar Exercises: Aerobic   UBE (Upper Arm Bike)  L2 sitting on green ball 2  forward, 2  back      Lumbar Exercises: Supine   Ab Set  10 reps    Isometric Hip Flexion  10 reps      Lumbar Exercises: Sidelying   Other Sidelying Lumbar Exercises  open books on right 10x      Lumbar Exercises: Quadruped   Other  Quadruped Lumbar Exercises  tall kneel rocks 10x       Moist Heat Therapy   Number Minutes Moist Heat  13 Minutes    Moist Heat Location  Lumbar Spine;Hip      Electrical Stimulation   Electrical Stimulation Location  lumbar hip    Electrical Stimulation Action  IFC    Electrical Stimulation Parameters  8 ma 15 min prone    Electrical Stimulation Goals  Pain      Manual Therapy   Kinesiotex  Facilitate Muscle   star pattern              PT Short Term Goals - 09/01/18 1046      PT SHORT TERM GOAL #1   Title  Pt will demo consistency and independence with her initial HEP to improve ROM and decrease pain.     Status  Achieved      PT SHORT TERM GOAL #2   Title  Pt will demo consistency use and independent set up of lumbar roll to improve seated posture throughout the day.     Status  Achieved      PT SHORT TERM GOAL #3   Title  Pt will report atleast 30% improvement in her pain from the start of PT, to increase session participation.     Time  4    Period  Weeks    Status  Achieved        PT Long Term Goals - 07/29/18 1037      PT LONG TERM GOAL #1   Title  Pt will demo improved Rt hip strength to 5/5 MMT which will increase her ability to use proper mechanics while lifting.     Time  8    Period  Weeks    Status  New    Target Date  09/28/18      PT LONG TERM GOAL #2   Title  Pt will be able to lift a 10# box from the floor 3/5 trials without the need for cues for proper technique.     Time  8    Period  Weeks    Status  New      PT LONG TERM GOAL #3   Title  Pt will report atleast 75% improvement in her low back/buttock pain from the start of PT, to allow her to resume regular work activity with activity.     Time  8    Period  Weeks    Status  New      PT LONG TERM GOAL #4   Title  Pt will be able to complete active lumbar ROM with no more than 2/10 discomfort to improve her participation in daily activity.     Time  8    Period  Weeks    Status   New            Plan - 09/01/18 1047    Clinical Impression Statement  The patient expresses concern about proposed ESI secondary to fear of needles.  Her pain score continues to be at a moderate level but rates her overall improvement at 60-70%.  All STGs met.  She is needs frequent modification of exercises secondary to complaints of pain but tolerates sitting, left sidelying and tall kneeling fairly well.  Good pain relief with ES/heat and kinesiotaping.      Rehab Potential  Good    PT Frequency  2x / week    PT Duration  8 weeks    PT Treatment/Interventions  ADLs/Self Care Home Management;Moist Heat;Electrical Stimulation;Cryotherapy;Therapeutic exercise;Patient/family education;Passive range of motion;Manual techniques;Therapeutic activities;Neuromuscular re-education;Dry needling;Taping    PT Next Visit Plan  Do FOTO next visit;  low level core stabilization;  functional reactivation;  ES/heat;  KT    PT Home Exercise Plan  Access Code: [email or web address]        Patient will benefit from skilled therapeutic intervention in order to improve the following deficits and impairments:  Decreased activity tolerance, Decreased strength, Impaired flexibility, Postural dysfunction, Pain, Improper body mechanics, Decreased range of motion, Increased muscle spasms  Visit Diagnosis: Pain in right hip  Muscle spasm of back  Muscle weakness (generalized)     Problem List Patient Active Problem List   Diagnosis Date Noted  . Decrease in appetite 07/05/2011  . Epigastric abdominal pain   . Weight loss    [name], PT 09/01/18 12:03 PM Phone: [phone] Fax: [phone]  [name] 09/01/2018,  11:59 AM  [facility] [address] [place], [place], [address] Phone: [phone]   Fax:  [phone]  Name: Linda Craig MRN: [id number] Date of Birth: May 18, 1996

## 2018-09-03 ENCOUNTER — Other Ambulatory Visit: Payer: Self-pay | Admitting: Orthopedic Surgery

## 2018-09-03 ENCOUNTER — Encounter: Payer: Self-pay | Admitting: Physical Therapy

## 2018-09-03 ENCOUNTER — Ambulatory Visit: Payer: 59 | Admitting: Physical Therapy

## 2018-09-03 DIAGNOSIS — M25551 Pain in right hip: Secondary | ICD-10-CM | POA: Diagnosis not present

## 2018-09-03 DIAGNOSIS — M545 Low back pain, unspecified: Secondary | ICD-10-CM

## 2018-09-03 DIAGNOSIS — M6281 Muscle weakness (generalized): Secondary | ICD-10-CM

## 2018-09-03 DIAGNOSIS — M62830 Muscle spasm of back: Secondary | ICD-10-CM

## 2018-09-03 DIAGNOSIS — G8929 Other chronic pain: Secondary | ICD-10-CM

## 2018-09-03 NOTE — Therapy (Signed)
[facility] [address] [place], [place], [address] Phone: [phone]   Fax:  [phone]  Physical Therapy Treatment  Patient Details  Name: Linda Craig MRN: [id number] Date of Birth: Apr 20, 1996 Referring Provider (PT): Dibas Docia Chuck, MD   Encounter Date: 09/03/2018  PT End of Session - 09/03/18 1055    Visit Number  7    Date for PT Re-Evaluation  09/28/18    Authorization Time Period  07/29/18 to 09/28/18    Authorization - Number of Visits  30    PT Start Time  1014    PT Stop Time  1105    PT Time Calculation (min)  51 min    Activity Tolerance  Patient tolerated treatment well       Past Medical History:  Diagnosis Date  . Abdominal pain   . Weight loss     History reviewed. No pertinent surgical history.  There were no vitals filed for this visit.  Subjective Assessment - 09/03/18 1014    Subjective  Waiting for call to schedule MRI.  Had numbness in hip while riding in the car but was able to walk it out.      Currently in Pain?  No/denies    Pain Score  0-No pain         [facility] PT Assessment - 09/03/18 0001      Observation/Other Assessments   Focus on Therapeutic Outcomes (FOTO)   52% limitation                    [facility] Adult PT Treatment/Exercise - 09/03/18 0001      Lumbar Exercises: Stretches   Other Lumbar Stretch Exercise  foam roll childs pose    Other Lumbar Stretch Exercise  foam roll pretzel 5x right and left       Lumbar Exercises: Aerobic   Nustep  L1 8 min       Lumbar Exercises: Standing   Other Standing Lumbar Exercises  Pallof series red band 5x each SLS       Lumbar Exercises: Sidelying   Clam  Right;10 reps    Other Sidelying Lumbar Exercises  open books on right 10x      Lumbar Exercises: Quadruped   Other Quadruped Lumbar Exercises  tall kneel rocks 10x     Other Quadruped Lumbar Exercises  Charl[employer] arms, bil UE reach; red band        Cryotherapy   Number Minutes Cryotherapy  13 Minutes    Cryotherapy Location  Lumbar Spine    Type of Cryotherapy  Ice pack      Electrical Stimulation   Electrical Stimulation Location  lumbar hip    Electrical Stimulation Action  IFC    Electrical Stimulation Parameters  8 ma 13 min prone    Electrical Stimulation Goals  Pain      Kinesiotix   Facilitate Muscle   star pattern lumbar             PT Education - 09/03/18 1228    Education Details   Access Code: [email or web address] Pallof series; 1/2 kneel;  quadruped thread the needle    Person(s) Educated  Patient    Methods  Explanation;Demonstration;Handout    Comprehension  Returned demonstration;Verbalized understanding       PT Short Term Goals - 09/01/18 1046      PT SHORT TERM GOAL #1   Title  Pt will demo consistency and independence with her  initial HEP to improve ROM and decrease pain.     Status  Achieved      PT SHORT TERM GOAL #2   Title  Pt will demo consistency use and independent set up of lumbar roll to improve seated posture throughout the day.     Status  Achieved      PT SHORT TERM GOAL #3   Title  Pt will report atleast 30% improvement in her pain from the start of PT, to increase session participation.     Time  4    Period  Weeks    Status  Achieved        PT Long Term Goals - 07/29/18 1037      PT LONG TERM GOAL #1   Title  Pt will demo improved Rt hip strength to 5/5 MMT which will increase her ability to use proper mechanics while lifting.     Time  8    Period  Weeks    Status  New    Target Date  09/28/18      PT LONG TERM GOAL #2   Title  Pt will be able to lift a 10# box from the floor 3/5 trials without the need for cues for proper technique.     Time  8    Period  Weeks    Status  New      PT LONG TERM GOAL #3   Title  Pt will report atleast 75% improvement in her low back/buttock pain from the start of PT, to allow her to resume regular work activity with activity.     Time  8     Period  Weeks    Status  New      PT LONG TERM GOAL #4   Title  Pt will be able to complete active lumbar ROM with no more than 2/10 discomfort to improve her participation in daily activity.     Time  8    Period  Weeks    Status  New            Plan - 09/03/18 1052    Clinical Impression Statement  Good improvement in FOTO functional outcome score.  She is able to perform a progression of core anti-rotary strengthening today with minimal reports of LBP today.  Difficulty stabilizing in 1/2 kneel and standing positions.  Good pain relief with ES and kinesiotaping.  Therapist closely monitoring response with all treatment interventions.      Rehab Potential  Good    PT Frequency  2x / week    PT Duration  8 weeks    PT Treatment/Interventions  ADLs/Self Care Home Management;Moist Heat;Electrical Stimulation;Cryotherapy;Therapeutic exercise;Patient/family education;Passive range of motion;Manual techniques;Therapeutic activities;Neuromuscular re-education;Dry needling;Taping    PT Next Visit Plan  moderate level core stabilization;  functional reactivation;  ES/heat;  KT    PT Home Exercise Plan  Access Code: [email or web address]        Patient will benefit from skilled therapeutic intervention in order to improve the following deficits and impairments:  Decreased activity tolerance, Decreased strength, Impaired flexibility, Postural dysfunction, Pain, Improper body mechanics, Decreased range of motion, Increased muscle spasms  Visit Diagnosis: Pain in right hip  Muscle spasm of back  Muscle weakness (generalized)     Problem List Patient Active Problem List   Diagnosis Date Noted  . Decrease in appetite 07/05/2011  . Epigastric abdominal pain   . Weight loss    Lavinia Sharps, PT 09/03/18  12:30 PM Phone: [phone] Fax: [phone]  [name] 09/03/2018, 12:29 PM  [facility] [address], [address] [place], [place], [address] Phone: [phone]   Fax:  [phone]  Name: Linda Craig MRN: [id number] Date of Birth: 1996/11/02

## 2018-09-03 NOTE — Patient Instructions (Signed)
Access Code: [email or web address]  URL: [email or web address]  Date: 09/03/2018  Prepared by: Lavinia Sharps   Exercises  Supine Gluteus Stretch - 3 reps - 2 sets - 30 hold - 1x daily - 7x weekly  Supine Figure 4 Piriformis Stretch - 3 reps - 30 hold - 1x daily - 7x weekly  Supine Lower Trunk Rotation - 10 reps - 2 sets - 1x daily - 7x weekly  Supine Transversus Abdominis Bracing - Hands on Stomach - 10 reps - 1 sets - 1x daily - 7x weekly  Hooklying Isometric Clamshell - 10 reps - 2 sets - 1x daily - 7x weekly  Hooklying Isometric Hip Flexion - 10 reps - 1 sets - 1x daily - 7x weekly  Bird Dog - 10 reps - 1 sets - 1x daily - 7x weekly  Cat Cow - 10 reps - 1 sets - 1x daily - 7x weekly  Quadruped Thoracic Rotation - Reach Under - 10 reps - 1 sets - 1x daily - 7x weekly  Half Kneeling Chop - 10 reps - 1 sets - 1x daily - 7x weekly  Half-Kneeling Trunk Rotation - 10 reps - 1 sets - 1x daily - 7x weekly  Standing Anti-Rotation Press with Anchored Resistance - 10 reps - 1 sets - 1x daily - 7x weekly

## 2018-09-16 ENCOUNTER — Other Ambulatory Visit: Payer: Self-pay | Admitting: Orthopedic Surgery

## 2018-09-16 DIAGNOSIS — G8929 Other chronic pain: Secondary | ICD-10-CM

## 2018-09-16 DIAGNOSIS — M545 Low back pain: Principal | ICD-10-CM

## 2018-09-19 ENCOUNTER — Ambulatory Visit
Admission: RE | Admit: 2018-09-19 | Discharge: 2018-09-19 | Disposition: A | Discharge status: Home / Self Care | Payer: 59 | Source: Ambulatory Visit | Attending: Orthopedic Surgery | Admitting: Orthopedic Surgery

## 2018-09-19 DIAGNOSIS — G8929 Other chronic pain: Secondary | ICD-10-CM

## 2018-09-19 DIAGNOSIS — M545 Low back pain: Principal | ICD-10-CM

## 2018-09-22 ENCOUNTER — Encounter: Payer: Self-pay | Admitting: Physical Therapy

## 2018-09-22 ENCOUNTER — Ambulatory Visit: Payer: 59 | Admitting: Physical Therapy

## 2018-09-22 DIAGNOSIS — M6281 Muscle weakness (generalized): Secondary | ICD-10-CM

## 2018-09-22 DIAGNOSIS — M25551 Pain in right hip: Secondary | ICD-10-CM | POA: Diagnosis not present

## 2018-09-22 DIAGNOSIS — M62830 Muscle spasm of back: Secondary | ICD-10-CM

## 2018-09-22 NOTE — Therapy (Signed)
[facility] [address], [address] [place], [place], [address] Phone: [phone]   Fax:  [phone]  Physical Therapy Treatment  Patient Details  Name: Linda Craig MRN: [id number] Date of Birth: 01/18/1996 Referring Provider (PT): Dibas Docia Chuck, MD   Encounter Date: 09/22/2018  PT End of Session - 09/22/18 1005    Visit Number  8    Date for PT Re-Evaluation  09/28/18    Authorization Time Period  07/29/18 to 09/28/18    Authorization - Number of Visits  30    PT Start Time  0845    PT Stop Time  0933    PT Time Calculation (min)  48 min    Activity Tolerance  Patient tolerated treatment well       Past Medical History:  Diagnosis Date  . Abdominal pain   . Weight loss     History reviewed. No pertinent surgical history.  There were no vitals filed for this visit.  Subjective Assessment - 09/22/18 0848    Subjective  Had my MRI but haven't gotten the results yet.    Going to see the doctor next week to see about that shot.  I've been OK.      How long can you sit comfortably?  makes a difference sitting in front seat vs. back seat    How long can you walk comfortably?  [place]    Currently in Pain?  No/denies    Pain Score  0-No pain    Pain Location  Back    Pain Orientation  Lower    Pain Type  Chronic pain    Aggravating Factors   numb in my hips when lying on my side                       [facility] Adult PT Treatment/Exercise - 09/22/18 0001      Lumbar Exercises: Aerobic   Nustep  L3 6 min      Lumbar Exercises: Machines for Strengthening   Leg Press  bil 65#; 35# single leg 15x each    Other Lumbar Machine Exercise  20# 2x10 seated row    Other Lumbar Machine Exercise  20# 2x10 seated lat bar      Lumbar Exercises: Standing   Other Standing Lumbar Exercises  --    Other Standing Lumbar Exercises  25# resisted walking 4 ways 3x each       Lumbar Exercises:  Quadruped   Other Quadruped Lumbar Exercises  1/2 kneel on BOSU with red band UE exs 5x each       Moist Heat Therapy   Number Minutes Moist Heat  13 Minutes    Moist Heat Location  Lumbar Spine;Hip      Electrical Stimulation   Electrical Stimulation Location  lumbar hip    Electrical Stimulation Action  IFC    Electrical Stimulation Parameters  8 ma 13 min     Electrical Stimulation Goals  Pain               PT Short Term Goals - 09/01/18 1046      PT SHORT TERM GOAL #1   Title  Pt will demo consistency and independence with her initial HEP to improve ROM and decrease pain.     Status  Achieved      PT SHORT TERM GOAL #2   Title  Pt will demo consistency use and independent  set up of lumbar roll to improve seated posture throughout the day.     Status  Achieved      PT SHORT TERM GOAL #3   Title  Pt will report atleast 30% improvement in her pain from the start of PT, to increase session participation.     Time  4    Period  Weeks    Status  Achieved        PT Long Term Goals - 07/29/18 1037      PT LONG TERM GOAL #1   Title  Pt will demo improved Rt hip strength to 5/5 MMT which will increase her ability to use proper mechanics while lifting.     Time  8    Period  Weeks    Status  New    Target Date  09/28/18      PT LONG TERM GOAL #2   Title  Pt will be able to lift a 10# box from the floor 3/5 trials without the need for cues for proper technique.     Time  8    Period  Weeks    Status  New      PT LONG TERM GOAL #3   Title  Pt will report atleast 75% improvement in her low back/buttock pain from the start of PT, to allow her to resume regular work activity with activity.     Time  8    Period  Weeks    Status  New      PT LONG TERM GOAL #4   Title  Pt will be able to complete active lumbar ROM with no more than 2/10 discomfort to improve her participation in daily activity.     Time  8    Period  Weeks    Status  New            Plan -  09/22/18 1005    Clinical Impression Statement  The patient is able to perform a progression of core and extremity strengthening.  She is smiling and able to converse with the therapist throughout the session but reports her "muscles are screaming on the inside."  Verbal and tactile cues to avoid bil knee hyperextension in standing and on the leg press.  Improving stability noted even in more challenging positions.  Therapist closely monitoring response with all treatment interventions.  She continues to report good pain relief with ES/heat.      Rehab Potential  Good    PT Frequency  2x / week    PT Duration  8 weeks    PT Treatment/Interventions  ADLs/Self Care Home Management;Moist Heat;Electrical Stimulation;Cryotherapy;Therapeutic exercise;Patient/family education;Passive range of motion;Manual techniques;Therapeutic activities;Neuromuscular re-education;Dry needling;Taping    PT Next Visit Plan  moderate level core stabilization;  functional reactivation;  ES/heat;  KT;  sees MD next week; recheck progress toward goals next visit     PT Home Exercise Plan  Access Code: [email or web address]        Patient will benefit from skilled therapeutic intervention in order to improve the following deficits and impairments:  Decreased activity tolerance, Decreased strength, Impaired flexibility, Postural dysfunction, Pain, Improper body mechanics, Decreased range of motion, Increased muscle spasms  Visit Diagnosis: Pain in right hip  Muscle spasm of back  Muscle weakness (generalized)     Problem List Patient Active Problem List   Diagnosis Date Noted  . Decrease in appetite 07/05/2011  . Epigastric abdominal pain   . Weight loss  Lavinia Sharps, PT 09/22/18 10:14 AM Phone: [phone] Fax: [phone]  [name] 09/22/2018, 10:13 AM  [facility] [address], [address] [place], [place], [address] Phone: [phone]   Fax:   [phone]  Name: Odeth Bry MRN: [id number] Date of Birth: Feb 16, 1996

## 2018-09-24 ENCOUNTER — Ambulatory Visit: Payer: 59 | Admitting: Physical Therapy

## 2018-10-08 ENCOUNTER — Encounter: Payer: Self-pay | Admitting: Physical Therapy

## 2018-10-08 ENCOUNTER — Ambulatory Visit: Payer: 59 | Attending: Family Medicine | Admitting: Physical Therapy

## 2018-10-08 DIAGNOSIS — M6281 Muscle weakness (generalized): Secondary | ICD-10-CM | POA: Insufficient documentation

## 2018-10-08 DIAGNOSIS — M25551 Pain in right hip: Secondary | ICD-10-CM | POA: Insufficient documentation

## 2018-10-08 DIAGNOSIS — M62830 Muscle spasm of back: Secondary | ICD-10-CM | POA: Insufficient documentation

## 2018-10-08 NOTE — Therapy (Signed)
[facility] [address] [place], [place], [address] Phone: [phone]   Fax:  [phone]  Physical Therapy Treatment/Recertification   Patient Details  Name: Linda Craig MRN: [id number] Date of Birth: July 30, 1996 Referring Provider (PT): Dibas Docia Chuck, MD   Encounter Date: 10/08/2018  PT End of Session - 10/08/18 2052    Visit Number  9    Number of Visits  30    Date for PT Re-Evaluation  11/19/18    Authorization - Number of Visits  30    PT Start Time  1057    PT Stop Time  1147    PT Time Calculation (min)  50 min    Activity Tolerance  Patient tolerated treatment well       Past Medical History:  Diagnosis Date  . Abdominal pain   . Weight loss     History reviewed. No pertinent surgical history.  There were no vitals filed for this visit.  Subjective Assessment - 10/08/18 1057    Subjective  My MRI came back alright.  He thinks it's muscular.  He thinks PT will help.  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  Constant tightness in right low back and hip.  I want to try the DN again.      How long can you sit comfortably?  makes a difference sitting in front seat vs. back seat    Patient Stated Goals  decrease pain in her back     Currently in Pain?  Yes    Pain Score  1     Pain Location  Back    Pain Orientation  Right    Aggravating Factors   lying on my right side         [facility] PT Assessment - 10/08/18 0001      Assessment   Medical Diagnosis  chronic low back pain     Referring Provider (PT)  Dibas Koirala, MD    Onset Date/Surgical Date  --   End of January      Observation/Other Assessments   Focus on Therapeutic Outcomes (FOTO)   48% limitation       AROM   Lumbar Flexion  50    Lumbar Extension  30    Lumbar - Right Rotation  25% limited producing right cramp    Lumbar - Left Rotation  WFLs no pain       Strength   Strength Assessment Site  --   trunk flexors and  extensor 4/5   Right Hip Flexion  5/5    Right Hip External Rotation   4+/5    Right Hip Internal Rotation  4+/5    Right Hip ABduction  4/5    Left Hip Flexion  5/5    Left Hip Extension  5/5    Left Hip External Rotation  5/5    Left Hip Internal Rotation  5/5    Left Hip ABduction  5/5                   [facility] Adult PT Treatment/Exercise - 10/08/18 0001      Therapeutic Activites    Therapeutic Activities  ADL's;Lifting;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#    Work Simulation  how to assist with patient transfers 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;  use of gait belt       Lumbar Exercises: Aerobic   Nustep  L3 6 min      Lumbar Exercises: Machines for Strengthening   Leg Press  bil 65#; 35# single leg 15x each      Lumbar Exercises: Supine   Other Supine Lumbar Exercises  ab brace with single leg lowering 5x each side       Moist Heat Therapy   Number Minutes Moist Heat  15 Minutes    Moist Heat Location  Lumbar Spine;Hip      Electrical Stimulation   Electrical Stimulation Location  lumbar hip    Electrical Stimulation Action  IFC    Electrical Stimulation Parameters  9 ma 15 min     Electrical Stimulation Goals  Pain      Manual Therapy   Joint Mobilization  pelvic distraction/ neutral gapping    Soft tissue mobilization  lumbar paraspinals, QL, gluteals        Trigger Point Dry Needling - 10/08/18 2104    Consent Given?  Yes    Muscles Treated Lower Body  --   right QL, right lumbar multifidi    Gluteus Maximus Response  Palpable increased muscle length             PT Short Term Goals - 10/08/18 2106      PT SHORT TERM GOAL #1   Title  Pt will demo consistency and independence with her initial HEP to improve ROM and decrease pain.     Status  Achieved      PT SHORT TERM GOAL #2   Title  Pt will demo consistency use and independent set up of lumbar roll to improve seated posture throughout the day.     Status  Achieved       PT SHORT TERM GOAL #3   Title  Pt will report atleast 30% improvement in her pain from the start of PT, to increase session participation.     Status  Achieved        PT Long Term Goals - 10/08/18 2053      PT LONG TERM GOAL #1   Title  Pt will demo improved Rt hip strength to 5/5 MMT which will increase her ability to use proper mechanics while lifting.     Time  6    Status  On-going    Target Date  11/19/18      PT LONG TERM GOAL #2   Title  Pt will be able to lift a 10# box from the floor 3/5 trials without the need for cues for proper technique.     Status  Achieved      PT LONG TERM GOAL #3   Title  Pt will report atleast 80% improvement in her low back/buttock pain from the start of PT, to allow her to resume regular work activity with activity.     Time  6    Period  Weeks    Status  Revised      PT LONG TERM GOAL #4   Title  Pt will be able to complete active lumbar flexion and right rotation WFLS and with no more than 2/10 discomfort to improve her participation in daily activity.     Time  6    Period  Weeks    Status  Revised      PT LONG TERM GOAL #5   Title  The patient will be able to walk 1 mile with minimal back and hip symptoms    Time  6    Period  Weeks    Status  New      Additional Long Term Goals   Additional Long Term Goals  Yes      PT LONG TERM GOAL #6   Title  FOTO functional outcome score improved to 45% limitation indicating improved function with less pain     Time  6    Period  Weeks    Status  New            Plan - 10/08/18 2052    Clinical Impression Statement  The patient reports an overall 70% improvement in pain.  Her ROM is much improved but still limited in flexion and painful with right rotation.  Right hip and core strength have improved but still with lower abdominal weakness as well as hip abductor and flexor weakness.  Tender points in right quadratus lumborum and paraspinal muscles.  She has not attempted to walk  longer distances.  Her FOTO functional outcome score has improved but still with functional deficits.  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  She should meet remaining goals in 4-6 more visits.      Rehab Potential  Good    PT Frequency  1x / week    PT Duration  6 weeks    PT Treatment/Interventions  ADLs/Self Care Home Management;Moist Heat;Electrical Stimulation;Cryotherapy;Therapeutic exercise;Patient/family education;Passive range of motion;Manual techniques;Therapeutic activities;Neuromuscular re-education;Dry needling;Taping    PT Next Visit Plan  moderate level core stabilization;  functional reactivation;  treadmill or elliptical;  ES/heat;  KT;  DN as needed right QL, lumbar multifidi ;  decreased frequency to 1x/week     PT Home Exercise Plan  Access Code: [email or web address]        Patient will benefit from skilled therapeutic intervention in order to improve the following deficits and impairments:  Decreased activity tolerance, Decreased strength, Impaired flexibility, Postural dysfunction, Pain, Improper body mechanics, Decreased range of motion, Increased muscle spasms  Visit Diagnosis: Pain in right hip - Plan: PT plan of care cert/re-cert  Muscle spasm of back - Plan: PT plan of care cert/re-cert  Muscle weakness (generalized) - Plan: PT plan of care cert/re-cert     Problem List Patient Active Problem List   Diagnosis Date Noted  . Decrease in appetite 07/05/2011  . Epigastric abdominal pain   . Weight loss    Lavinia Sharps, PT 10/08/18 9:09 PM Phone: [phone] Fax: [phone]  [name] 10/08/2018, 9:09 PM  [facility] [address], [address] [place], [place], [address] Phone: [phone]   Fax:  [phone]  Name: Linda Craig MRN: [id number] Date of Birth:  17-Feb-1996

## 2018-10-23 ENCOUNTER — Ambulatory Visit: Payer: 59 | Admitting: Physical Therapy

## 2018-10-23 ENCOUNTER — Encounter: Payer: Self-pay | Admitting: Physical Therapy

## 2018-10-23 DIAGNOSIS — M62830 Muscle spasm of back: Secondary | ICD-10-CM

## 2018-10-23 DIAGNOSIS — M6281 Muscle weakness (generalized): Secondary | ICD-10-CM

## 2018-10-23 DIAGNOSIS — M25551 Pain in right hip: Secondary | ICD-10-CM | POA: Diagnosis not present

## 2018-10-23 NOTE — Therapy (Signed)
[facility] [address], [address] [place], [place], [address] Phone: [phone]   Fax:  [phone]  Physical Therapy Treatment  Patient Details  Name: Linda Craig MRN: [id number] Date of Birth: 03-06-1996 Referring Provider (PT): Dibas Docia ChuckKoirala, MD   Encounter Date: 10/23/2018  PT End of Session - 10/23/18 1207    Visit Number  10    Number of Visits  30    Date for PT Re-Evaluation  11/19/18    Authorization - Number of Visits  30    PT Start Time  1100    PT Stop Time  1145    PT Time Calculation (min)  45 min    Activity Tolerance  Patient tolerated treatment well       Past Medical History:  Diagnosis Date  . Abdominal pain   . Weight loss     History reviewed. No pertinent surgical history.  There were no vitals filed for this visit.  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  I had to take a muscle relaxer to sleep.  I'm in so much pain!    Currently in Pain?  Yes    Pain Score  9     Pain Location  Back    Pain Orientation  Right;Left                       [facility] Adult PT Treatment/Exercise - 10/23/18 0001      Self-Care   Heat/Ice Application  use of heat and Biofreeze for pain relief at home       Lumbar Exercises: Quadruped   Other Quadruped Lumbar Exercises  review of HEP and discussion of which ex's to focus on to help with this exacerbation of pain       Electrical Stimulation   Electrical Stimulation Location  lumbar hip    Electrical Stimulation Action  IFC    Electrical Stimulation Parameters  8 ma 15 min prone    Electrical Stimulation Goals  Pain      Manual Therapy   Joint Mobilization  pelvic distraction/ neutral gapping    Soft tissue mobilization  bil lumbar musculature with Biofreeze for pain control                PT Short Term Goals - 10/08/18 2106      PT SHORT  TERM GOAL #1   Title  Pt will demo consistency and independence with her initial HEP to improve ROM and decrease pain.     Status  Achieved      PT SHORT TERM GOAL #2   Title  Pt will demo consistency use and independent set up of lumbar roll to improve seated posture throughout the day.     Status  Achieved      PT SHORT TERM GOAL #3   Title  Pt will report atleast 30% improvement in her pain from the start of PT, to increase session participation.     Status  Achieved        PT Long Term Goals - 10/08/18 2053      PT LONG TERM GOAL #1   Title  Pt will demo improved Rt hip strength to 5/5 MMT which will increase her ability to use proper mechanics while lifting.     Time  6    Status  On-going  Target Date  11/19/18      PT LONG TERM GOAL #2   Title  Pt will be able to lift a 10# box from the floor 3/5 trials without the need for cues for proper technique.     Status  Achieved      PT LONG TERM GOAL #3   Title  Pt will report atleast 80% improvement in her low back/buttock pain from the start of PT, to allow her to resume regular work activity with activity.     Time  6    Period  Weeks    Status  Revised      PT LONG TERM GOAL #4   Title  Pt will be able to complete active lumbar flexion and right rotation WFLS and with no more than 2/10 discomfort to improve her participation in daily activity.     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  She reports the pain is severe upon arrival.  She states she has not been able to do any of her home stretches b/c it hurts too much.  Treatment focus on pain management today.  She reports good pain relief with ES/heat as well as soft tissue mobilization.  Discussed HEP appropriate for the weekend and self care strategies.   Patient is aware of good body mechanics but states this patient did not have a bed that could be elevated and required max assist for transfers.      Rehab Potential  Good    PT Frequency  1x / week    PT Duration  6 weeks    PT Treatment/Interventions  ADLs/Self Care Home Management;Moist Heat;Electrical Stimulation;Cryotherapy;Therapeutic exercise;Patient/family education;Passive range of motion;Manual techniques;Therapeutic activities;Neuromuscular re-education;Dry needling;Taping    PT Next Visit Plan  moderate level core stabilization;  functional reactivation;  treadmill or elliptical;  ES/heat;  KT;  DN as needed right QL, lumbar multifidi ;  decreased frequency to 1x/week     PT Home Exercise Plan  Access Code: [email or web address]        Patient will benefit from skilled therapeutic intervention in order to improve the following deficits and impairments:  Decreased activity tolerance, Decreased strength, Impaired flexibility, Postural dysfunction, Pain, Improper body mechanics, Decreased range of motion, Increased muscle spasms  Visit Diagnosis: Pain in right hip  Muscle spasm of back  Muscle weakness (generalized)     Problem List Patient Active Problem List   Diagnosis Date Noted  . Decrease in appetite 07/05/2011  . Epigastric abdominal pain   . Weight loss    Linda Arnold  Craig, PT 10/23/18 12:07 PM Phone: [phone] Fax: [phone]  [name] 10/23/2018, 12:07 PM  [facility] [address] [place], [place], [address] Phone:  [phone]   Fax:  [phone]  Name: Linda Craig MRN: [id number] Date of Birth: 1996-04-20

## 2018-11-13 ENCOUNTER — Ambulatory Visit: Payer: 59 | Admitting: Physical Therapy

## 2018-11-20 ENCOUNTER — Ambulatory Visit: Payer: 59 | Attending: Family Medicine | Admitting: Physical Therapy

## 2018-11-20 ENCOUNTER — Encounter: Payer: Self-pay | Admitting: Physical Therapy

## 2018-11-20 DIAGNOSIS — M25551 Pain in right hip: Secondary | ICD-10-CM | POA: Insufficient documentation

## 2018-11-20 DIAGNOSIS — M62830 Muscle spasm of back: Secondary | ICD-10-CM

## 2018-11-20 DIAGNOSIS — M6281 Muscle weakness (generalized): Secondary | ICD-10-CM | POA: Diagnosis present

## 2018-11-20 NOTE — Therapy (Addendum)
[facility] [address], [place], [place], [address] Phone: [phone]   Fax:  [phone]  Physical Therapy Treatment/Recertification /Discharge Summary   Patient Details  Name: Linda Craig MRN: [id number] Date of Birth: 1996/09/15 Referring Provider (PT): Dibas Dorthy Cooler, MD   Encounter Date: 11/20/2018  PT End of Session - 11/20/18 0936    Visit Number  11    Number of Visits  30    Date for PT Re-Evaluation  01/01/2019   Authorization Time Period  07/29/18 to 09/28/18    Authorization - Number of Visits  30    PT Start Time  0934    PT Stop Time  1017    PT Time Calculation (min)  43 min       Past Medical History:  Diagnosis Date  . Abdominal pain   . Weight loss     History reviewed. No pertinent surgical history.  There were no vitals filed for this visit.  Subjective Assessment - 11/20/18 0936    Subjective  Pt reports her back feels tight, but "so much better".  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 She reports she has not done much of the exercises or walked much, though she states she knows she should.       Patient Stated Goals  decrease pain in her back     Currently in Pain?  No/denies    Pain Score  0-No pain         [facility] PT Assessment - 11/20/18 0001      Assessment   Medical Diagnosis  chronic low back pain     Referring Provider (PT)  Dibas Koirala, MD    Onset Date/Surgical Date  --   End of January      Observation/Other Assessments   Focus on Therapeutic Outcomes (FOTO)   17% limitation      Strength   Right Hip Flexion  5/5    Right Hip Extension  5/5    Right Hip External Rotation   5/5    Right Hip Internal Rotation  5/5    Right Hip ABduction  5/5       [facility] Adult PT Treatment/Exercise - 11/20/18 0001      Lumbar Exercises: Stretches   Passive Hamstring Stretch  Left;Right;3 reps    Passive Hamstring Stretch Limitations  trial in sitting -  increased back discomfort; switched to supine    Piriformis Stretch  Right;Left;2 reps;30 seconds   supine     Lumbar Exercises: Aerobic   Elliptical  L4: 5 min    PTA present to discuss progress and monitor.      Lumbar Exercises: Standing   Other Standing Lumbar Exercises  squat to lift 10# to floor (min cues for form and to engage abdominals) x 10       Lumbar Exercises: Supine   Heel Slides  10 reps   with ab set   Bridge with Diona Foley Squeeze  10 reps;5 seconds    Single Leg Bridge  5 reps      Lumbar Exercises: Prone   Straight Leg Raise  10 reps      Modalities   Modalities  --   pt declined     Manual Therapy   Manual Therapy  Muscle Energy Technique;Soft tissue mobilization    Soft tissue mobilization  TPR to Rt glute med with active hip IR/ER (pt in prone)     Muscle  Energy Technique  MET to correct ant rotated Right ilium with contract relax of Rt hamstring, 2 sets; MET to correct Rt sacral torsion.               PT Short Term Goals - 10/08/18 2106      PT SHORT TERM GOAL #1   Title  Pt will demo consistency and independence with her initial HEP to improve ROM and decrease pain.     Status  Achieved      PT SHORT TERM GOAL #2   Title  Pt will demo consistency use and independent set up of lumbar roll to improve seated posture throughout the day.     Status  Achieved      PT SHORT TERM GOAL #3   Title  Pt will report atleast 30% improvement in her pain from the start of PT, to increase session participation.     Status  Achieved        PT Long Term Goals - 11/20/18 0941      PT LONG TERM GOAL #1   Title  Pt will demo improved Rt hip strength to 5/5 MMT which will increase her ability to use proper mechanics while lifting.     Time  6    Status  Achieved      PT LONG TERM GOAL #2   Title  Pt will be able to lift a 10# box from the floor 3/5 trials without the need for cues for proper technique.     Time  8    Period  Weeks    Status  Achieved       PT LONG TERM GOAL #3   Title  Pt will report atleast 80% improvement in her low back/buttock pain from the start of PT, to allow her to resume regular work activity with activity.     Time  6    Period  Weeks    Status  Achieved   pt reports 90% improvement- 11/20/18     PT LONG TERM GOAL #4   Title  Pt will be able to complete active lumbar flexion and right rotation WFLS and with no more than 2/10 discomfort to improve her participation in daily activity.     Time  6    Period  Weeks    Status  Achieved      PT LONG TERM GOAL #5   Title  The patient will be able to walk 1 mile with minimal back and hip symptoms    Time  6    Period  Weeks    Status  On-going   pt has not attempted.      PT LONG TERM GOAL #6   Title  FOTO functional outcome score improved to 45% limitation indicating improved function with less pain     Time  6    Period  Weeks    Status  Achieved            Plan - 11/20/18 1148    Clinical Impression Statement  Pt reporting 90% improvement in LBP since initiating therapy; has met LTG#3.  Her FOTO score has improved to 17% limitation.  She tolerated all exercises well, except seated hamstring stretch (improved tolerance in supine position).  Slight asymmetry in pelvis; improved alignment with MET correction.  Pt has met all but one goal and is agreeable to hold therapy while she works on [employer].      Rehab Potential  Good  PT Frequency  1x / week    PT Duration  6 weeks    PT Treatment/Interventions  ADLs/Self Care Home Management;Moist Heat;Electrical Stimulation;Cryotherapy;Therapeutic exercise;Patient/family education;Passive range of motion;Manual techniques;Therapeutic activities;Neuromuscular re-education;Dry needling;Taping    PT Home Exercise Plan  Access Code: [email or web address] - added single leg bridges and prone hip ext.     Consulted and Agree with Plan of Care  Patient      PHYSICAL THERAPY DISCHARGE SUMMARY  Visits from Start of Care:  11  Current functional level related to goals / functional outcomes: See clinical impressions above  Remaining deficits: As above   Education / Equipment: Comprehensive HEP Plan: Patient agrees to discharge.  Patient goals were partially met. Patient is being discharged due to being pleased with the current functional level.  ?????       Patient will benefit from skilled therapeutic intervention in order to improve the following deficits and impairments:  Decreased activity tolerance, Decreased strength, Impaired flexibility, Postural dysfunction, Pain, Improper body mechanics, Decreased range of motion, Increased muscle spasms  Visit Diagnosis: Pain in right hip  Muscle spasm of back  Muscle weakness (generalized)     Problem List Patient Active Problem List   Diagnosis Date Noted  . Decrease in appetite 07/05/2011  . Epigastric abdominal pain   . Weight loss    [name], PT 11/20/18 12:12 PM Phone: [phone] Fax: [place], PTA 11/20/18 11:53 AM  [facility] [address] [place], [place], [address] Phone: [phone]   Fax:  [phone]  Name: Yitta Gongaware MRN: [id number] Date of Birth: 03-29-1996

## 2018-12-04 ENCOUNTER — Telehealth: Payer: Self-pay | Admitting: Physical Therapy

## 2018-12-04 ENCOUNTER — Ambulatory Visit: Payer: 59 | Attending: Family Medicine | Admitting: Physical Therapy

## 2018-12-04 NOTE — Telephone Encounter (Signed)
Attempted to call patient.  Unable to leave message secondary to voicemailbox is full.

## 2018-12-11 ENCOUNTER — Ambulatory Visit: Payer: 59 | Admitting: Physical Therapy

## 2020-07-29 IMAGING — MR MR LUMBAR SPINE W/O CM
4 of 5 series · 26 of 48 positions shown · non-contrast
Comparison: None.

CLINICAL DATA: Right low back and right hip pain beginning
12/26/2017. No known injury.

EXAM:
MRI LUMBAR SPINE WITHOUT CONTRAST
TECHNIQUE: Multiplanar, multisequence MR imaging of the lumbar spine was
performed. No intravenous contrast was administered.

[Series 2: T2 · sagittal · 4.0mm · 0.55mm/px · 6 of 13 slices shown (1 of 2)]
[im 1/13]
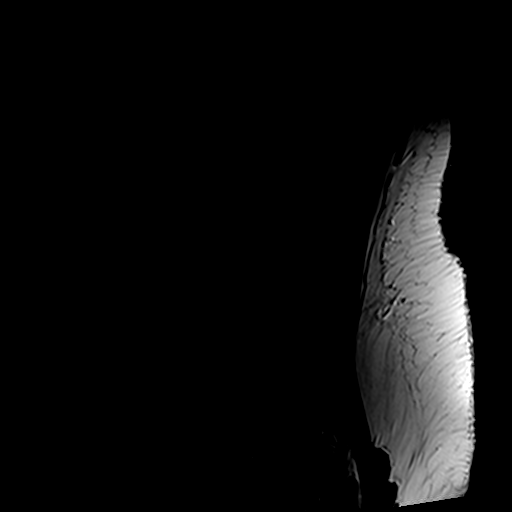
[im 3/13]
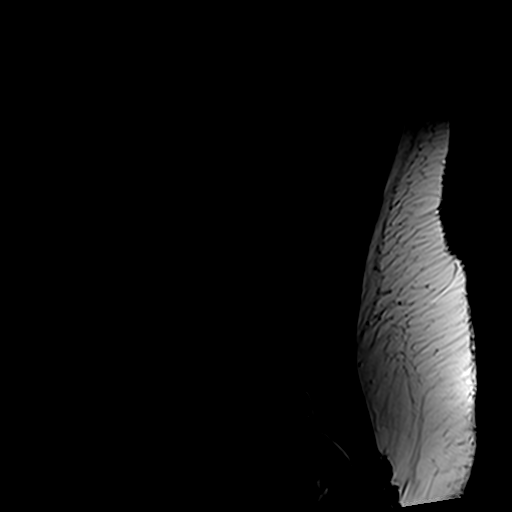
[im 5/13]
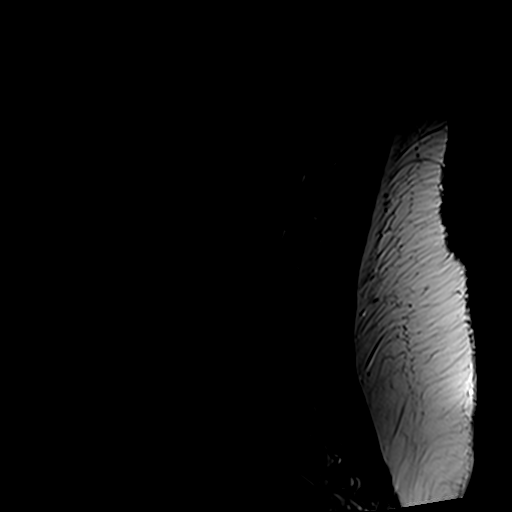
[im 8/13]
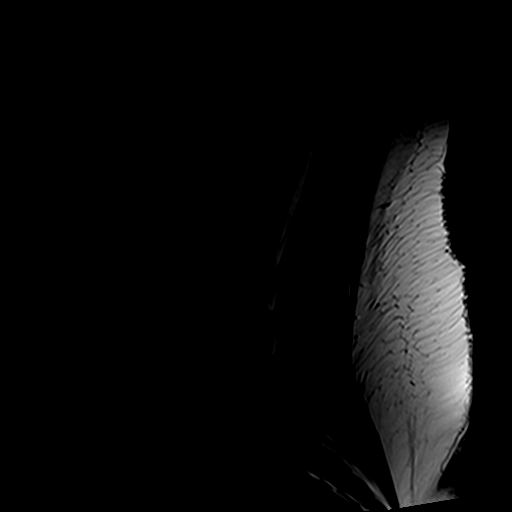
[im 10/13]
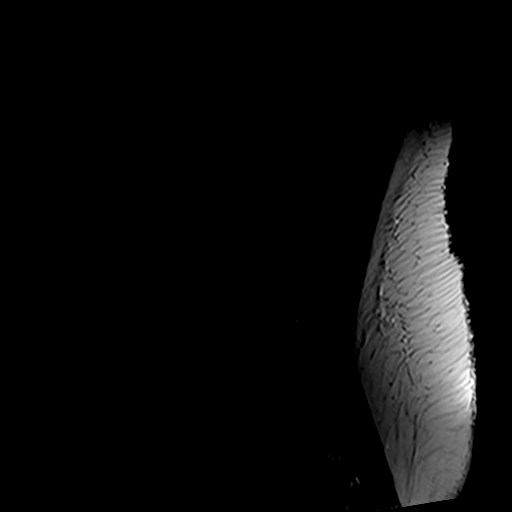
[im 13/13]
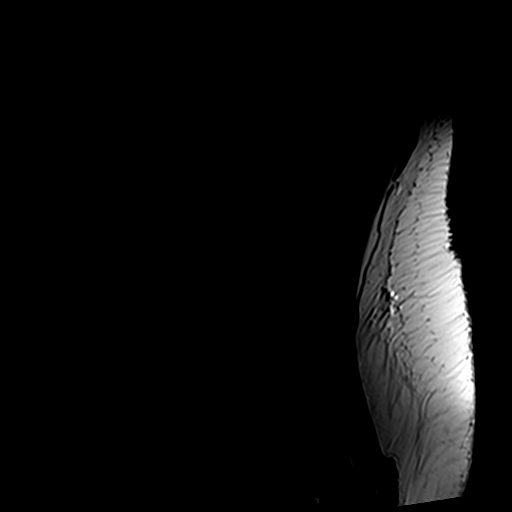

[Series 4: T1 · sagittal · 4.0mm · 0.55mm/px · 6 of 13 slices shown (1 of 2)]
[im 1/13]
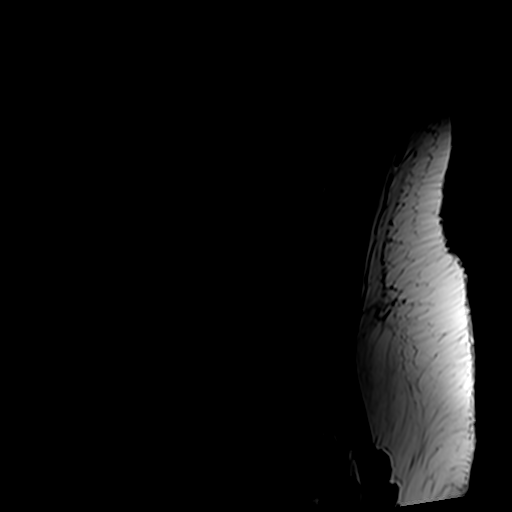
[im 3/13]
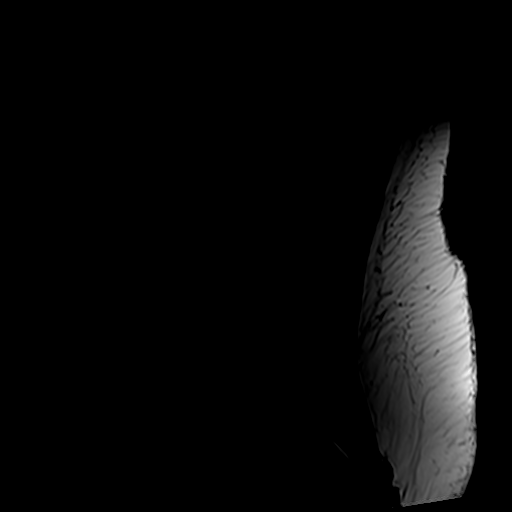
[im 5/13]
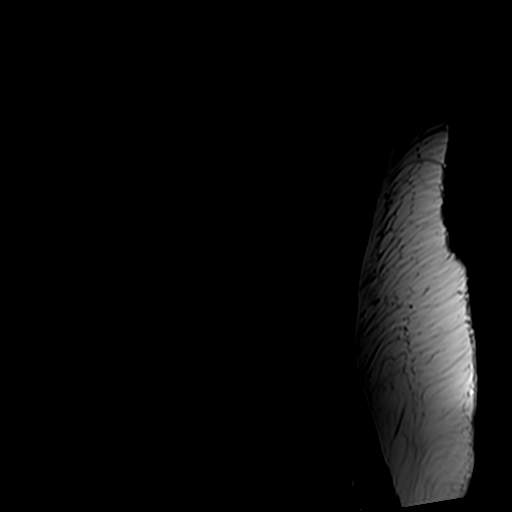
[im 8/13]
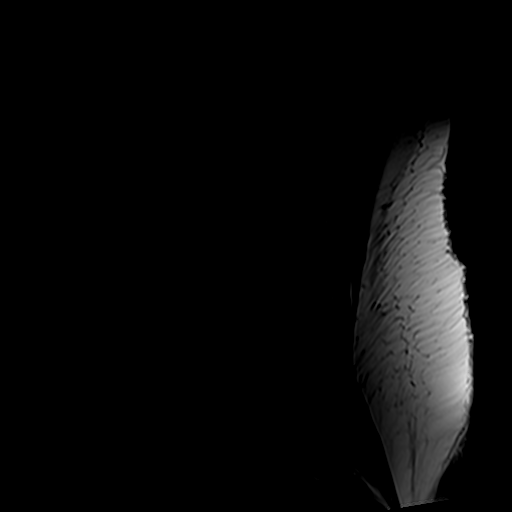
[im 10/13]
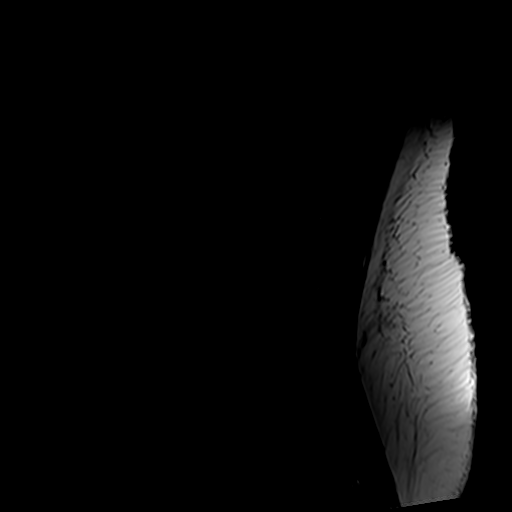
[im 13/13]
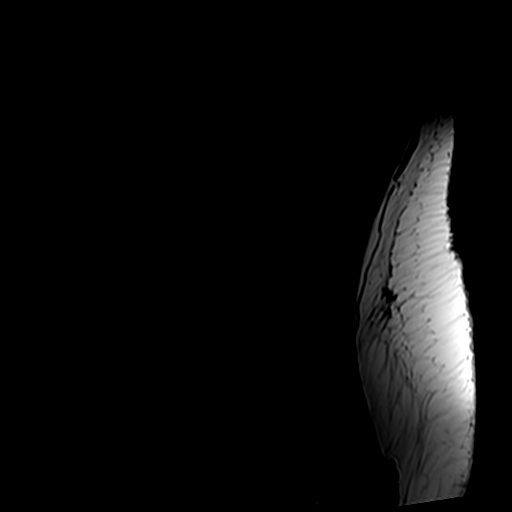

[Series 5: T2 · axial · 4.0mm · 0.70mm/px · z∈[-94,+110]mm · 9 of 35 slices shown (2 of 2)]
[im 1/35]
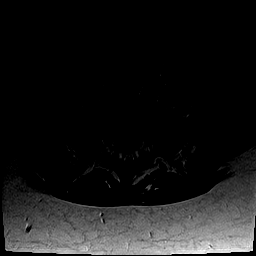
[im 5/35]
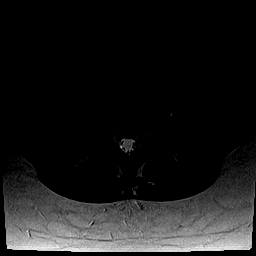
[im 10/35]
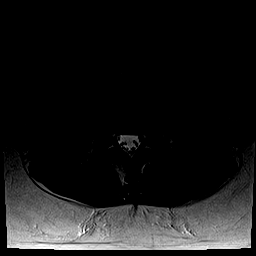
[im 15/35]
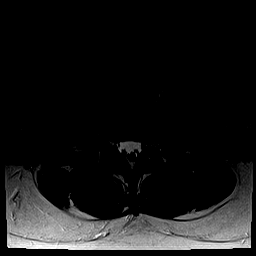
[im 18/35]
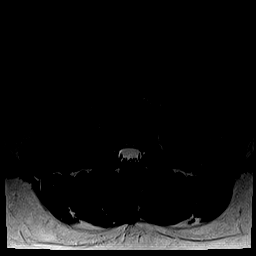
[im 20/35]
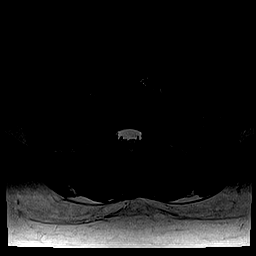
[im 25/35]
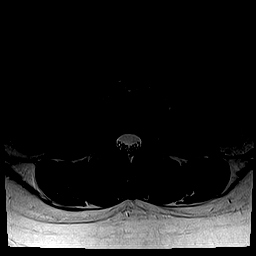
[im 30/35]
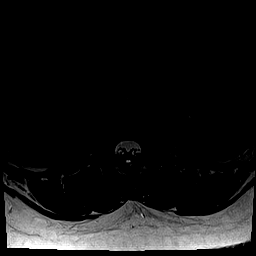
[im 35/35]
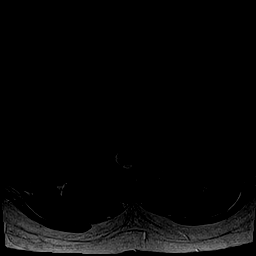

[Series 6: T1 · axial · 4.0mm · 0.35mm/px · z∈[-94,+84]mm · 5 of 35 slices shown (2 of 2)]
[im 1/35]
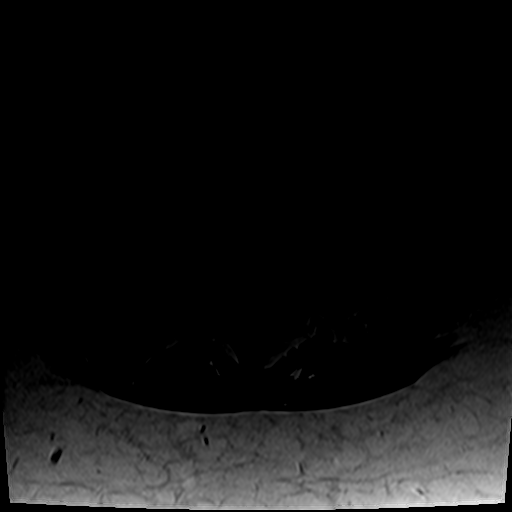
[im 5/35]
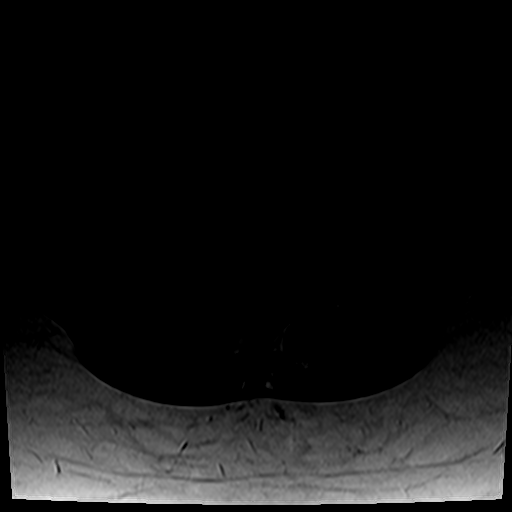
[im 10/35]
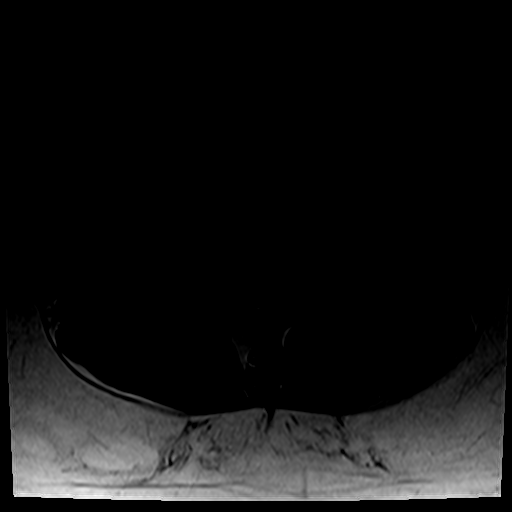
[im 18/35]
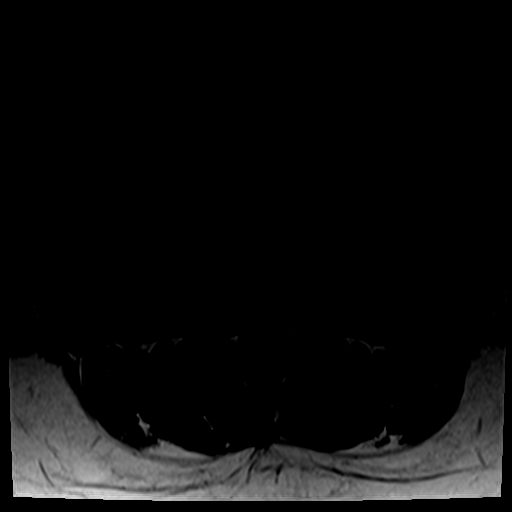
[im 30/35]
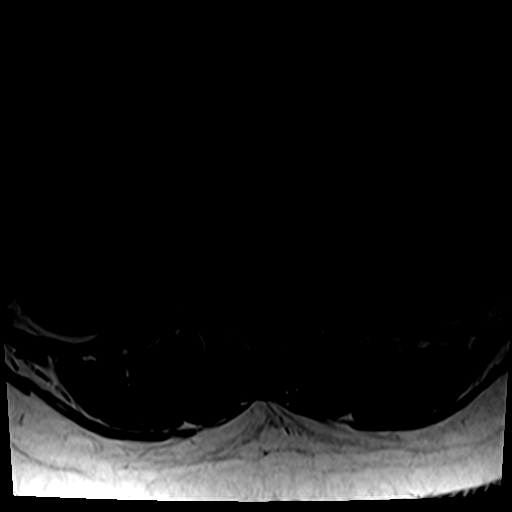

[26 of 48 positions shown; findings below may reference images not displayed]

FINDINGS: Segmentation:  Standard.

Alignment:  Normal.

Vertebrae:  Height and signal are normal.  No pars defect.

Conus medullaris and cauda equina: Conus extends to the L1-2 level.
Conus and cauda equina appear normal.

Paraspinal and other soft tissues: Negative.

Disc levels:

Disc height and hydration are maintained at all levels. The central
spinal canal and foramina are widely patent throughout.
IMPRESSION: Normal lumbar spine MRI.

## 2021-12-02 NOTE — L&D Delivery Note (Signed)
Delivery Note At 12:49 AM a viable and healthy female was delivered via Vaginal, Spontaneous (Presentation:   Occiput Anterior).  APGAR: 9, 9; weight 6 lb 8.8 oz (2970 g).   Placenta status: Spontaneous, Intact.  Cord: 3 vessels with the following complications: None.  Cord pH: n/a  Anesthesia: Epidural Episiotomy: None Lacerations: 1st degree;Vaginal;Sulcus Suture Repair: 3.0 chromic Est. Blood Loss (mL): 100  Mom to postpartum.  Baby to Couplet care / Skin to Skin.  Dantrell Schertzer A Anwen Cannedy 05/14/2022, 2:21 AM

## 2021-12-06 LAB — OB RESULTS CONSOLE HEPATITIS B SURFACE ANTIGEN: Hepatitis B Surface Ag: NEGATIVE

## 2021-12-06 LAB — OB RESULTS CONSOLE HIV ANTIBODY (ROUTINE TESTING)
HIV: NONREACTIVE
HIV: NONREACTIVE

## 2021-12-06 LAB — OB RESULTS CONSOLE RUBELLA ANTIBODY, IGM: Rubella: IMMUNE

## 2022-05-02 LAB — OB RESULTS CONSOLE ABO/RH: RH Type: POSITIVE

## 2022-05-02 LAB — OB RESULTS CONSOLE GBS: GBS: POSITIVE

## 2022-05-13 ENCOUNTER — Inpatient Hospital Stay (HOSPITAL_COMMUNITY): Payer: Managed Care, Other (non HMO) | Admitting: Anesthesiology

## 2022-05-13 ENCOUNTER — Encounter (HOSPITAL_COMMUNITY): Payer: Self-pay | Admitting: Obstetrics and Gynecology

## 2022-05-13 ENCOUNTER — Inpatient Hospital Stay (HOSPITAL_COMMUNITY)
Admission: AD | Admit: 2022-05-13 | Discharge: 2022-05-15 | DRG: 807 | Disposition: A | Discharge status: Home / Self Care | Payer: Managed Care, Other (non HMO) | Attending: Obstetrics and Gynecology | Admitting: Obstetrics and Gynecology

## 2022-05-13 ENCOUNTER — Inpatient Hospital Stay (HOSPITAL_COMMUNITY): Payer: Managed Care, Other (non HMO)

## 2022-05-13 ENCOUNTER — Other Ambulatory Visit: Payer: Self-pay

## 2022-05-13 DIAGNOSIS — O43123 Velamentous insertion of umbilical cord, third trimester: Secondary | ICD-10-CM | POA: Diagnosis present

## 2022-05-13 DIAGNOSIS — D573 Sickle-cell trait: Secondary | ICD-10-CM | POA: Diagnosis present

## 2022-05-13 DIAGNOSIS — O43193 Other malformation of placenta, third trimester: Secondary | ICD-10-CM | POA: Diagnosis present

## 2022-05-13 DIAGNOSIS — O99824 Streptococcus B carrier state complicating childbirth: Secondary | ICD-10-CM | POA: Diagnosis present

## 2022-05-13 DIAGNOSIS — Z3A39 39 weeks gestation of pregnancy: Secondary | ICD-10-CM | POA: Diagnosis not present

## 2022-05-13 DIAGNOSIS — O9902 Anemia complicating childbirth: Secondary | ICD-10-CM | POA: Diagnosis present

## 2022-05-13 LAB — CBC
HCT: 35.3 % — ABNORMAL LOW (ref 36.0–46.0)
Hemoglobin: 12 g/dL (ref 12.0–15.0)
MCH: 27.1 pg (ref 26.0–34.0)
MCHC: 34 g/dL (ref 30.0–36.0)
MCV: 79.7 fL — ABNORMAL LOW (ref 80.0–100.0)
Platelets: 285 10*3/uL (ref 150–400)
RBC: 4.43 MIL/uL (ref 3.87–5.11)
RDW: 12.8 % (ref 11.5–15.5)
WBC: 7.8 10*3/uL (ref 4.0–10.5)
nRBC: 0 % (ref 0.0–0.2)

## 2022-05-13 LAB — RPR: RPR Ser Ql: NONREACTIVE

## 2022-05-13 LAB — TYPE AND SCREEN
ABO/RH(D): O POS
Antibody Screen: NEGATIVE

## 2022-05-13 MED ORDER — DIPHENHYDRAMINE HCL 50 MG/ML IJ SOLN: 12.5000 mg | INTRAMUSCULAR | Status: DC | PRN

## 2022-05-13 MED ORDER — FENTANYL CITRATE (PF) 100 MCG/2ML IJ SOLN
100.0000 ug | INTRAMUSCULAR | Status: DC | PRN
  Administered 2022-05-13: 100 ug via INTRAVENOUS
  Filled 2022-05-13: qty 2

## 2022-05-13 MED ORDER — OXYCODONE-ACETAMINOPHEN 5-325 MG PO TABS: 1.0000 | ORAL_TABLET | ORAL | Status: DC | PRN

## 2022-05-13 MED ORDER — OXYTOCIN BOLUS FROM INFUSION
333.0000 mL | Freq: Once | INTRAVENOUS | Status: AC
  Administered 2022-05-14: 333 mL via INTRAVENOUS

## 2022-05-13 MED ORDER — CLINDAMYCIN PHOSPHATE 900 MG/50ML IV SOLN: 900.0000 mg | Freq: Three times a day (TID) | INTRAVENOUS | Status: DC

## 2022-05-13 MED ORDER — LIDOCAINE HCL (PF) 1 % IJ SOLN: 30.0000 mL | INTRAMUSCULAR | Status: DC | PRN

## 2022-05-13 MED ORDER — LIDOCAINE HCL (PF) 1 % IJ SOLN
INTRAMUSCULAR | Status: DC | PRN
  Administered 2022-05-13: 8 mL via EPIDURAL

## 2022-05-13 MED ORDER — OXYCODONE-ACETAMINOPHEN 5-325 MG PO TABS: 2.0000 | ORAL_TABLET | ORAL | Status: DC | PRN

## 2022-05-13 MED ORDER — FLEET ENEMA 7-19 GM/118ML RE ENEM: 1.0000 | ENEMA | Freq: Once | RECTAL | Status: DC

## 2022-05-13 MED ORDER — EPHEDRINE 5 MG/ML INJ
10.0000 mg | INTRAVENOUS | Status: DC | PRN
Start: 2022-05-13 — End: 2022-05-14

## 2022-05-13 MED ORDER — TERBUTALINE SULFATE 1 MG/ML IJ SOLN: 0.2500 mg | Freq: Once | INTRAMUSCULAR | Status: DC | PRN

## 2022-05-13 MED ORDER — ACETAMINOPHEN 325 MG PO TABS: 650.0000 mg | ORAL_TABLET | ORAL | Status: DC | PRN

## 2022-05-13 MED ORDER — PENICILLIN G POT IN DEXTROSE 60000 UNIT/ML IV SOLN
3.0000 10*6.[IU] | INTRAVENOUS | Status: DC
  Administered 2022-05-13 (×3): 3 10*6.[IU] via INTRAVENOUS
  Filled 2022-05-13 (×3): qty 50

## 2022-05-13 MED ORDER — LACTATED RINGERS IV SOLN: 500.0000 mL | INTRAVENOUS | Status: DC | PRN

## 2022-05-13 MED ORDER — FENTANYL-BUPIVACAINE-NACL 0.5-0.125-0.9 MG/250ML-% EP SOLN
12.0000 mL/h | EPIDURAL | Status: DC | PRN
  Administered 2022-05-13: 12 mL/h via EPIDURAL
  Filled 2022-05-13: qty 250

## 2022-05-13 MED ORDER — MISOPROSTOL 50MCG HALF TABLET
50.0000 ug | ORAL_TABLET | ORAL | Status: DC | PRN
  Administered 2022-05-13 (×2): 50 ug via BUCCAL
  Filled 2022-05-13 (×2): qty 1

## 2022-05-13 MED ORDER — OXYTOCIN-SODIUM CHLORIDE 30-0.9 UT/500ML-% IV SOLN
2.5000 [IU]/h | INTRAVENOUS | Status: DC
  Administered 2022-05-14: 2.5 [IU]/h via INTRAVENOUS
  Filled 2022-05-13: qty 500

## 2022-05-13 MED ORDER — FENTANYL-BUPIVACAINE-NACL 0.5-0.125-0.9 MG/250ML-% EP SOLN: 12.0000 mL/h | EPIDURAL | Status: DC | PRN

## 2022-05-13 MED ORDER — OXYTOCIN BOLUS FROM INFUSION: 333.0000 mL | Freq: Once | INTRAVENOUS | Status: DC

## 2022-05-13 MED ORDER — OXYTOCIN 10 UNIT/ML IJ SOLN: 10.0000 [IU] | Freq: Once | INTRAMUSCULAR | Status: DC

## 2022-05-13 MED ORDER — SOD CITRATE-CITRIC ACID 500-334 MG/5ML PO SOLN
30.0000 mL | ORAL | Status: DC | PRN
  Administered 2022-05-13: 30 mL via ORAL
  Filled 2022-05-13: qty 30

## 2022-05-13 MED ORDER — LACTATED RINGERS IV SOLN: INTRAVENOUS | Status: DC

## 2022-05-13 MED ORDER — LACTATED RINGERS IV SOLN
500.0000 mL | Freq: Once | INTRAVENOUS | Status: AC
  Administered 2022-05-13: 500 mL via INTRAVENOUS

## 2022-05-13 MED ORDER — MISOPROSTOL 50MCG HALF TABLET: 50.0000 ug | ORAL_TABLET | ORAL | Status: DC

## 2022-05-13 MED ORDER — PHENYLEPHRINE 80 MCG/ML (10ML) SYRINGE FOR IV PUSH (FOR BLOOD PRESSURE SUPPORT): 80.0000 ug | PREFILLED_SYRINGE | INTRAVENOUS | Status: DC | PRN

## 2022-05-13 MED ORDER — EPHEDRINE 5 MG/ML INJ: 10.0000 mg | INTRAVENOUS | Status: DC | PRN

## 2022-05-13 MED ORDER — OXYTOCIN-SODIUM CHLORIDE 30-0.9 UT/500ML-% IV SOLN
1.0000 m[IU]/min | INTRAVENOUS | Status: DC
  Administered 2022-05-13 (×2): 2 m[IU]/min via INTRAVENOUS
  Administered 2022-05-13: 12 m[IU]/min via INTRAVENOUS
  Administered 2022-05-13: 14 m[IU]/min via INTRAVENOUS
  Administered 2022-05-13: 4 m[IU]/min via INTRAVENOUS
  Administered 2022-05-13: 10 m[IU]/min via INTRAVENOUS
  Filled 2022-05-13: qty 500

## 2022-05-13 MED ORDER — ONDANSETRON HCL 4 MG/2ML IJ SOLN
4.0000 mg | Freq: Four times a day (QID) | INTRAMUSCULAR | Status: DC | PRN
  Administered 2022-05-13: 4 mg via INTRAVENOUS
  Filled 2022-05-13: qty 2

## 2022-05-13 MED ORDER — LACTATED RINGERS IV SOLN
INTRAVENOUS | Status: DC
Start: 2022-05-13 — End: 2022-05-14
  Administered 2022-05-13: 125 mL/h via INTRAVENOUS

## 2022-05-13 MED ORDER — SODIUM CHLORIDE 0.9 % IV SOLN
5.0000 10*6.[IU] | Freq: Once | INTRAVENOUS | Status: AC
  Administered 2022-05-13: 5 10*6.[IU] via INTRAVENOUS
  Filled 2022-05-13: qty 5

## 2022-05-13 MED ORDER — SOD CITRATE-CITRIC ACID 500-334 MG/5ML PO SOLN: 30.0000 mL | ORAL | Status: DC | PRN

## 2022-05-13 NOTE — Progress Notes (Signed)
S: on the ball  O: BP 114/65   Pulse 72   Temp 98.2 F (36.8 C) (Oral)   Resp 18   Ht 5\' 2"  (1.575 m)   Wt 90.6 kg   LMP 08/11/2021   BMI 36.54 kg/m   Pitocin 8 miu VE 4/70/-3  intact  Balloon out  Tracing: baseline 130 (+) accel to 140-145 ? Ctx  IMP; marginal cord insertion Term gestation Gbs cx on IV pCN Latent phase P) cont pitocin

## 2022-05-13 NOTE — Progress Notes (Signed)
Linda Craig is Linda 26 y.o. G1P0 at [redacted]w[redacted]d by ultrasound admitted for induction of labor due to marginal cord insertion.  Subjective: No complaint. S/p cytotec) oral)  Objective: BP 133/79   Pulse 80   Temp 98.1 F (36.7 C) (Oral)   Resp 18   Ht 5\' 2"  (1.575 m)   Wt 90.6 kg   LMP 08/11/2021   BMI 36.54 kg/m  No intake/output data recorded. No intake/output data recorded.  FHT:  FHR: 120 bpm, variability: moderate,  accelerations:  Present,  decelerations:  Absent UC:   irreg SVE:   0 cm dilated, 60% effaced, -3 station soft Intracervical balloon placed Tracing: cat 1  Labs: Lab Results  Component Value Date   WBC 7.8 05/13/2022   HGB 12.0 05/13/2022   HCT 35.3 (L) 05/13/2022   MCV 79.7 (L) 05/13/2022   PLT 285 05/13/2022    Assessment / Plan: Marginal cord insertion GBS cx (+) on IV PCN Term gestation P) may eat light labor food. Start Pitocin when next cytotec would be due. IV PCN   Anticipated MOD:  NSVD  Linda Craig Linda Craig Want 05/13/2022, 9:14 AM

## 2022-05-13 NOTE — H&P (Signed)
Linda Craig is a 26 y.o. female presenting @ [redacted] wk gestation for IOL 2nd to marginal cord insertion. OB History     Gravida  1   Para  0   Term      Preterm      AB      Living         SAB      IAB      Ectopic      Multiple      Live Births             Past Medical History:  Diagnosis Date   Abdominal pain    Weight loss    History reviewed. No pertinent surgical history. Family History: family history includes Ulcers in an other family member. Social History:  reports that she has never smoked. She has never used smokeless tobacco. She reports that she does not currently use alcohol. She reports that she does not use drugs.     Maternal Diabetes: No Genetic Screening: Normal Maternal Ultrasounds/Referrals: Normal Fetal Ultrasounds or other Referrals:  None Maternal Substance Abuse:  No Significant Maternal Medications:  Meds include: Other:  valtrex Significant Maternal Lab Results:  Group B Strep positive Other Comments:   hx HSV    (+) chlam. TOC neg 3/13. SS trait   Review of Systems  All other systems reviewed and are negative.  History Dilation: Closed Effacement (%): 30 Station: -2 Exam by:: B Hanks RN Blood pressure 133/79, pulse 80, temperature 98.1 F (36.7 C), temperature source Oral, resp. rate 18, height 5\' 2"  (1.575 m), weight 90.6 kg, last menstrual period 08/11/2021. Maternal Exam:  Introitus: Normal vulva.   Physical Exam Constitutional:      Appearance: Normal appearance.  HENT:     Head: Atraumatic.  Cardiovascular:     Rate and Rhythm: Regular rhythm.  Pulmonary:     Breath sounds: Normal breath sounds.  Abdominal:     Comments: gravid  Genitourinary:    General: Normal vulva.  Musculoskeletal:     Cervical back: Neck supple.  Skin:    General: Skin is warm.  Neurological:     General: No focal deficit present.     Mental Status: She is alert and oriented to person, place, and time.  Psychiatric:        Mood  and Affect: Mood normal.        Behavior: Behavior normal.     Prenatal labs: ABO, Rh: --/--/O POS (06/12 0015) Antibody: NEG (06/12 0015) Rubella: Immune (01/05 0000) RPR: NON REACTIVE (06/12 0015)  HBsAg: Negative (01/05 0000)  HIV: Non-reactive, Non-reactive (01/05 0000)  GBS: Positive/-- (06/01 0000)   Assessment/Plan: Marginal cord insertion GBs cx (+) Term gestation P) admit routine labs, cytotec for cervical ripening. Intracervical balloon prn with pitocin. Analgesic prn. IV PCN   Keasia Dubose A Tyson Parkison 05/13/2022, 9:13 AM

## 2022-05-13 NOTE — Anesthesia Preprocedure Evaluation (Signed)
Anesthesia Evaluation  Patient identified by MRN, date of birth, ID band Patient awake    Reviewed: Allergy & Precautions, H&P , NPO status , Patient's Chart, lab work & pertinent test results, reviewed documented beta blocker date and time   Airway Mallampati: II  TM Distance: >3 FB Neck ROM: full    Dental no notable dental hx.    Pulmonary neg pulmonary ROS,    Pulmonary exam normal breath sounds clear to auscultation       Cardiovascular negative cardio ROS Normal cardiovascular exam Rhythm:regular Rate:Normal     Neuro/Psych negative neurological ROS  negative psych ROS   GI/Hepatic negative GI ROS, Neg liver ROS,   Endo/Other  negative endocrine ROS  Renal/GU negative Renal ROS  negative genitourinary   Musculoskeletal   Abdominal   Peds  Hematology negative hematology ROS (+)   Anesthesia Other Findings   Reproductive/Obstetrics (+) Pregnancy                             Anesthesia Physical Anesthesia Plan  ASA: 2  Anesthesia Plan: Epidural   Post-op Pain Management:    Induction:   PONV Risk Score and Plan: 2  Airway Management Planned: Natural Airway  Additional Equipment: None  Intra-op Plan:   Post-operative Plan:   Informed Consent: I have reviewed the patients History and Physical, chart, labs and discussed the procedure including the risks, benefits and alternatives for the proposed anesthesia with the patient or authorized representative who has indicated his/her understanding and acceptance.       Plan Discussed with: Anesthesiologist and CRNA  Anesthesia Plan Comments:         Anesthesia Quick Evaluation

## 2022-05-13 NOTE — Anesthesia Procedure Notes (Signed)
Epidural Patient location during procedure: OB Start time: 05/13/2022 7:01 PM End time: 05/13/2022 7:07 PM  Staffing Anesthesiologist: Janeece Riggers, MD  Preanesthetic Checklist Completed: patient identified, IV checked, site marked, risks and benefits discussed, surgical consent, monitors and equipment checked, pre-op evaluation and timeout performed  Epidural Patient position: sitting Prep: DuraPrep and site prepped and draped Patient monitoring: continuous pulse ox and blood pressure Approach: midline Location: L3-L4 Injection technique: LOR air  Needle:  Needle type: Tuohy  Needle gauge: 17 G Needle length: 9 cm and 9 Needle insertion depth: 7 cm Catheter type: closed end flexible Catheter size: 19 Gauge Catheter at skin depth: 12 cm Test dose: negative  Assessment Events: blood not aspirated, injection not painful, no injection resistance, no paresthesia and negative IV test

## 2022-05-14 ENCOUNTER — Encounter (HOSPITAL_COMMUNITY): Payer: Self-pay | Admitting: Obstetrics and Gynecology

## 2022-05-14 MED ORDER — OXYCODONE HCL 5 MG PO TABS: 5.0000 mg | ORAL_TABLET | ORAL | Status: DC | PRN

## 2022-05-14 MED ORDER — BENZOCAINE-MENTHOL 20-0.5 % EX AERO
1.0000 "application " | INHALATION_SPRAY | CUTANEOUS | Status: DC | PRN
  Filled 2022-05-14: qty 56

## 2022-05-14 MED ORDER — ZOLPIDEM TARTRATE 5 MG PO TABS: 5.0000 mg | ORAL_TABLET | Freq: Every evening | ORAL | Status: DC | PRN

## 2022-05-14 MED ORDER — OXYCODONE HCL 5 MG PO TABS: 10.0000 mg | ORAL_TABLET | ORAL | Status: DC | PRN

## 2022-05-14 MED ORDER — IBUPROFEN 600 MG PO TABS
600.0000 mg | ORAL_TABLET | Freq: Four times a day (QID) | ORAL | Status: DC
  Administered 2022-05-14 – 2022-05-15 (×4): 600 mg via ORAL
  Filled 2022-05-14 (×5): qty 1

## 2022-05-14 MED ORDER — SIMETHICONE 80 MG PO CHEW: 80.0000 mg | CHEWABLE_TABLET | ORAL | Status: DC | PRN

## 2022-05-14 MED ORDER — PRENATAL MULTIVITAMIN CH
1.0000 | ORAL_TABLET | Freq: Every day | ORAL | Status: DC
  Administered 2022-05-14: 1 via ORAL
  Filled 2022-05-14: qty 1

## 2022-05-14 MED ORDER — DOCUSATE SODIUM 100 MG PO CAPS
200.0000 mg | ORAL_CAPSULE | Freq: Two times a day (BID) | ORAL | Status: DC
  Administered 2022-05-14 (×2): 200 mg via ORAL
  Filled 2022-05-14 (×2): qty 2

## 2022-05-14 MED ORDER — ONDANSETRON HCL 4 MG PO TABS: 4.0000 mg | ORAL_TABLET | ORAL | Status: DC | PRN

## 2022-05-14 MED ORDER — FERROUS SULFATE 325 (65 FE) MG PO TABS
325.0000 mg | ORAL_TABLET | Freq: Two times a day (BID) | ORAL | Status: DC
  Administered 2022-05-14 (×2): 325 mg via ORAL
  Filled 2022-05-14 (×2): qty 1

## 2022-05-14 MED ORDER — WITCH HAZEL-GLYCERIN EX PADS: 1.0000 "application " | MEDICATED_PAD | CUTANEOUS | Status: DC | PRN

## 2022-05-14 MED ORDER — DIBUCAINE (PERIANAL) 1 % EX OINT: 1.0000 "application " | TOPICAL_OINTMENT | CUTANEOUS | Status: DC | PRN

## 2022-05-14 MED ORDER — SENNOSIDES-DOCUSATE SODIUM 8.6-50 MG PO TABS: 2.0000 | ORAL_TABLET | Freq: Every day | ORAL | Status: DC

## 2022-05-14 MED ORDER — ONDANSETRON HCL 4 MG/2ML IJ SOLN: 4.0000 mg | INTRAMUSCULAR | Status: DC | PRN

## 2022-05-14 MED ORDER — COCONUT OIL OIL: 1.0000 "application " | TOPICAL_OIL | Status: DC | PRN

## 2022-05-14 MED ORDER — ACETAMINOPHEN 325 MG PO TABS: 650.0000 mg | ORAL_TABLET | ORAL | Status: DC | PRN

## 2022-05-14 MED ORDER — DIPHENHYDRAMINE HCL 25 MG PO CAPS: 25.0000 mg | ORAL_CAPSULE | Freq: Four times a day (QID) | ORAL | Status: DC | PRN

## 2022-05-14 NOTE — Lactation Note (Signed)
This note was copied from a baby's chart. Lactation Consultation Note  Patient Name: Linda Craig [id number] Date: 05/14/2022 Reason for consult: Follow-up assessment Age:28 hours  P1, Baby has breastfed x 3 since birth and was showing feeding cues.  Baby eager.  Mother latched baby in cradle hold.  Offered to reposition but mother stated she felt comfortable.  Added pillow for support.  Baby latched with ease on both breasts with intermittent swallows.  Mother concerned about volume.  Reassured mother.  Feed on demand with cues.  Goal 8-12+ times per day after first 24 hrs.  Give baby drops on spoon if not latching.  Discussed cluster feeding.   Maternal Data Has patient been taught Hand Expression?: Yes Does the patient have breastfeeding experience prior to this delivery?: No  Feeding Mother's Current Feeding Choice: Breast Milk  LATCH Score Latch: Grasps breast easily, tongue down, lips flanged, rhythmical sucking.  Audible Swallowing: A few with stimulation  Type of Nipple: Everted at rest and after stimulation  Comfort (Breast/Nipple): Soft / non-tender  Hold (Positioning): Assistance needed to correctly position infant at breast and maintain latch.  LATCH Score: 8   Lactation Tools Discussed/Used    Interventions Interventions: Breast feeding basics reviewed;Assisted with latch;Skin to skin;Hand express;Education;LC Services brochure  Discharge    Consult Status Consult Status: Follow-up Date: 05/15/22 Follow-up type: In-patient    Dahlia Byes [facility] 05/14/2022, 8:44 AM

## 2022-05-14 NOTE — Anesthesia Postprocedure Evaluation (Signed)
Anesthesia Post Note  Patient: Nurse, adult  Procedure(s) Performed: AN AD HOC LABOR EPIDURAL     Patient location during evaluation: Mother Baby Anesthesia Type: Epidural Level of consciousness: awake and alert Pain management: pain level controlled Vital Signs Assessment: post-procedure vital signs reviewed and stable Respiratory status: spontaneous breathing, nonlabored ventilation and respiratory function stable Cardiovascular status: stable Postop Assessment: no headache, no backache and epidural receding Anesthetic complications: no   No notable events documented.  Last Vitals:  Vitals:   05/14/22 0230 05/14/22 0258  BP:  125/83  Pulse:  88  Resp:  18  Temp: (!) 36.3 C 36.5 C  SpO2:  100%    Last Pain:  Vitals:   05/14/22 0300  TempSrc:   PainSc: 0-No pain   Pain Goal:                Epidural/Spinal Function Cutaneous sensation: Tingles (05/14/22 0300), Patient able to flex knees: Yes (05/14/22 0300), Patient able to lift hips off bed: Yes (05/14/22 0300), Back pain beyond tenderness at insertion site: No (05/14/22 0300), Progressively worsening motor and/or sensory loss: No (05/14/22 0300), Bowel and/or bladder incontinence post epidural: No (05/14/22 0300)  Ponciano Shealy

## 2022-05-14 NOTE — Anesthesia Postprocedure Evaluation (Signed)
Anesthesia Post Note  Patient: Nurse, adult  Procedure(s) Performed: AN AD HOC LABOR EPIDURAL     Patient location during evaluation: Mother Baby Anesthesia Type: Epidural Level of consciousness: awake and alert Pain management: pain level controlled Vital Signs Assessment: post-procedure vital signs reviewed and stable Respiratory status: spontaneous breathing, nonlabored ventilation and respiratory function stable Cardiovascular status: stable Postop Assessment: no headache, no backache and epidural receding Anesthetic complications: no   No notable events documented.  Last Vitals:  Vitals:   05/14/22 0258 05/14/22 0357  BP: 125/83 133/84  Pulse: 88 71  Resp: 18 16  Temp: 36.5 C 36.7 C  SpO2: 100% 99%    Last Pain:  Vitals:   05/14/22 0720  TempSrc:   PainSc: 0-No pain   Pain Goal:                   Linda Craig

## 2022-05-14 NOTE — Progress Notes (Signed)
PPD{NUMBERS 1-5:20334} SVD:   S:  Pt reports feeling ***/ Tolerating po/ Voiding without problems/ No n/v/ Bleeding is {Description; bleeding vaginal:11356}/ Pain controlled with{treatments; pain control med:13496}  Newborn info ***   O:  A & O x 3 ***/ VS: Blood pressure 123/74, pulse 84, temperature 98 F (36.7 C), temperature source Oral, resp. rate 16, height 5\' 2"  (1.575 m), weight 90.6 kg, last menstrual period 08/11/2021, SpO2 98 %, unknown if currently breastfeeding.  LABS: No results found for this or any previous visit (from the past 24 hour(s)).  I&O: I/O last 3 completed shifts: In: -  Out: 525 [Urine:425; Blood:100]   No intake/output data recorded.  Lungs: {Exam; lungs:5033}  Heart: {Exam; heart:5510}  Abdomen: {PE ABDOMEN POSTPARTUM OBGYN:313106}  Perineum: {exam; perineum:10172}  Lochia: ***  Extremities:{pe extremities 10/11/2021    A/P: PPD # ***/ G1P1001  Doing well  Continue routine post partum orders  ***

## 2022-05-14 NOTE — Addendum Note (Signed)
Addendum  created 05/14/22 0833 by Ignacia Bayley, CRNA   Clinical Note Signed

## 2022-05-15 LAB — CBC
HCT: 33.6 % — ABNORMAL LOW (ref 36.0–46.0)
Hemoglobin: 11.2 g/dL — ABNORMAL LOW (ref 12.0–15.0)
MCH: 26.8 pg (ref 26.0–34.0)
MCHC: 33.3 g/dL (ref 30.0–36.0)
MCV: 80.4 fL (ref 80.0–100.0)
Platelets: 282 10*3/uL (ref 150–400)
RBC: 4.18 MIL/uL (ref 3.87–5.11)
RDW: 13.1 % (ref 11.5–15.5)
WBC: 12.5 10*3/uL — ABNORMAL HIGH (ref 4.0–10.5)
nRBC: 0 % (ref 0.0–0.2)

## 2022-05-15 MED ORDER — IBUPROFEN 600 MG PO TABS: 600.0000 mg | ORAL_TABLET | Freq: Four times a day (QID) | ORAL | 11 refills | Status: DC

## 2022-05-15 MED ORDER — FERROUS SULFATE 325 (65 FE) MG PO TABS: 325.0000 mg | ORAL_TABLET | Freq: Two times a day (BID) | ORAL | 11 refills | Status: DC

## 2022-05-15 NOTE — Progress Notes (Signed)
PPD1 SVD:   S:  Pt reports feeling well. Request early discharge/ Tolerating po/ Voiding without problems/ No n/v/ Bleeding is light/ Pain controlled with none  Newborn info  live female BRF   O:  A & O x 3 / VS: Blood pressure 118/76, pulse 79, temperature 98.3 F (36.8 C), temperature source Oral, resp. rate 17, height 5\' 2"  (1.575 m), weight 90.6 kg, last menstrual period 08/11/2021, SpO2 100 %, unknown if currently breastfeeding.  LABS:  Results for orders placed or performed during the hospital encounter of 05/13/22 (from the past 24 hour(s))  CBC     Status: Abnormal   Collection Time: 05/15/22  6:12 AM  Result Value Ref Range   WBC 12.5 (H) 4.0 - 10.5 K/uL   RBC 4.18 3.87 - 5.11 MIL/uL   Hemoglobin 11.2 (L) 12.0 - 15.0 g/dL   HCT 05/17/22 (L) 16.1 - 09.6 %   MCV 80.4 80.0 - 100.0 fL   MCH 26.8 26.0 - 34.0 pg   MCHC 33.3 30.0 - 36.0 g/dL   RDW 04.5 40.9 - 81.1 %   Platelets 282 150 - 400 K/uL   nRBC 0.0 0.0 - 0.2 %    I&O: I/O last 3 completed shifts: In: 0  Out: 525 [Urine:425; Blood:100]   No intake/output data recorded.  Lungs: chest clear, no wheezing, rales, normal symmetric air entry  Heart: regular rate and rhythm, S1, S2 normal, no murmur, click, rub or gallop  Abdomen: uterus firm at umbilicus  Perineum: not inspected  Lochia: light  Extremities:no redness or tenderness in the calves or thighs, no edema    A/P: PPD # 1/ G1P1001  Doing well  Continue routine post partum orders  D/c instructions reviewed. F/u 6 wk

## 2022-05-15 NOTE — Lactation Note (Signed)
This note was copied from a baby's chart. Lactation Consultation Note  Patient Name: Linda Craig [id number] Date: 05/15/2022 Reason for consult: Follow-up assessment Age:26  P1, Mother states she plans to breastfeed and formula feed until her milk supply transitions. Reviewed engorgement care and monitoring voids/stools.   Feeding Mother's Current Feeding Choice: Breast Milk and Formula   Interventions Interventions: Education  Discharge Discharge Education: Engorgement and breast care;Warning signs for feeding baby  Consult Status Consult Status: Complete Date: 05/15/22    Vivianne Master [facility] 05/15/2022, 11:13 AM

## 2022-05-15 NOTE — Discharge Summary (Signed)
Postpartum Discharge Summary  Date of Service updated     Patient Name: Linda Craig DOB: October 29, 1996 MRN: [id number]  Date of admission: 05/13/2022 Delivery date:05/14/2022  Delivering provider: Alyviah Crandle  Date of discharge: 05/15/2022  Admitting diagnosis: Marginal insertion of umbilical cord affecting management of mother in third trimester [O43.193] Postpartum care following vaginal delivery [Z39.2] Intrauterine pregnancy: [redacted]w[redacted]d    Secondary diagnosis:  Group B strep positive Additional problems: n/a    Discharge diagnosis: Term Pregnancy Delivered , GBS cx positive                                          Post partum procedures: n/a Augmentation: N/A Complications: None  Hospital course: Induction of Labor With Vaginal Delivery   26y.o. yo G1P1001 at 363w3das admitted to the hospital 05/13/2022 for induction of labor.  Indication for induction:  marginal cord insertion .  Patient had labor course as follows: oral cytotec for initial cervical ripening. IP foley placement with Pitocin. SROM . IV PCN  for (+) GBS Membrane Rupture Time/Date: 3:02 PM ,05/13/2022   Delivery Method:Vaginal, Spontaneous  Episiotomy: None  Lacerations:  1st degree;Vaginal;Sulcus  Details of delivery can be found in separate delivery note.  Patient had a routine postpartum course. Patient is discharged home on 05/15/2022  Newborn Data: Birth date:05/14/2022  Birth time:12:49 AM  Gender:Female  Living status:Living  Apgars:9 ,9  Weight:2.97 kg   Magnesium Sulfate received: No BMZ received: No Rhophylac:No MMR:No T-DaP:Given prenatally Flu: No Transfusion:No  Physical exam  Vitals:   05/14/22 2054 05/14/22 2215 05/15/22 0500 05/15/22 0605  BP: 134/88 123/77 128/89 118/76  Pulse: 77 77 90 79  Resp: 18  17   Temp: 98.5 F (36.9 C)  98.3 F (36.8 C)   TempSrc: Oral  Oral   SpO2: 100%  100%   Weight:      Height:       General: alert, cooperative, and no  distress Lochia: appropriate Uterine Fundus: firm Incision: N/A DVT Evaluation: No evidence of DVT seen on physical exam. No significant calf/ankle edema. Labs: Lab Results  Component Value Date   WBC 12.5 (H) 05/15/2022   HGB 11.2 (L) 05/15/2022   HCT 33.6 (L) 05/15/2022   MCV 80.4 05/15/2022   PLT 282 05/15/2022       No data to display         Edinburgh Score:    05/14/2022   12:33 PM  Edinburgh Postnatal Depression Scale Screening Tool  I have been able to laugh and see the funny side of things. 0  I have looked forward with enjoyment to things. 0  I have blamed myself unnecessarily when things went wrong. 0  I have been anxious or worried for no good reason. 0  I have felt scared or panicky for no good reason. 0  Things have been getting on top of me. 0  I have been so unhappy that I have had difficulty sleeping. 0  I have felt sad or miserable. 0  I have been so unhappy that I have been crying. 0  The thought of harming myself has occurred to me. 0  Edinburgh Postnatal Depression Scale Total 0      After visit meds:  Allergies as of 05/15/2022   No Known Allergies      Medication List  STOP taking these medications    esomeprazole 40 MG capsule Commonly known as: NexIUM       TAKE these medications    ferrous sulfate 325 (65 FE) MG tablet Take 1 tablet (325 mg total) by mouth 2 (two) times daily with a meal.   ibuprofen 600 MG tablet Commonly known as: ADVIL Take 1 tablet (600 mg total) by mouth every 6 (six) hours.         Discharge home in stable condition Infant Feeding: Breast Infant Disposition:home with mother Discharge instruction: per After Visit Summary and Postpartum booklet. Activity: Advance as tolerated. Pelvic rest for 6 weeks.  Diet: routine diet Anticipated Birth Control: OCPs Postpartum Appointment:6 weeks Additional Postpartum F/U:  n/a Future Appointments:No future appointments. Follow up Visit:  Follow-up  Information     Servando Salina, MD Follow up in 6 week(s).   Specialty: Obstetrics and Gynecology Contact information: [address] [place] [address] [phone]                     05/15/2022 Marvene Staff, MD

## 2022-05-15 NOTE — Discharge Instructions (Signed)
Call if temperature greater than equal to 100.4, nothing per vagina for 4-6 weeks or severe nausea vomiting, increased incisional pain , drainage or redness in the incision site, no straining with bowel movements, showers no bath °

## 2022-05-20 ENCOUNTER — Encounter (HOSPITAL_COMMUNITY): Payer: Self-pay | Admitting: Obstetrics and Gynecology

## 2022-05-22 ENCOUNTER — Telehealth (HOSPITAL_COMMUNITY): Payer: Self-pay | Admitting: *Deleted

## 2022-05-22 NOTE — Telephone Encounter (Signed)
Attempted Hospital Discharge Follow-Up Call.  Left voice mail requesting that patient return RN's phone call.  

## 2022-05-22 NOTE — Telephone Encounter (Signed)
Hospital Discharge Follow-Up Call:  Patient reports that she is well and has no concerns about her healing process.  EPDS today was 0 and she endorses this accurately reflects that she is doing well emotionally.  She declines hospital PP group information.  Patient says that baby is well.  She asked about using gas drops for the baby as she seems to have gas pain sometimes after eating.  We discussed strategies for alleviating gas pain including burping the baby after feeding, "bicycle pumping" baby's legs, and abdominal massage in clockwise direction.  Advised patient to speak to pediatrician before giving any medicine to baby.  She reports that baby sleeps in bassinet insert on [employer] beside patient's bed. Reviewed ABCs of Safe Sleep.

## 2022-12-02 NOTE — L&D Delivery Note (Signed)
Delivery Note At 1552 a viable and healthy female was delivered over intact perineum via svd (Presentation: cephalic; OA ).  APGAR: 8,9 ; weight 6 lb 15 oz (3147 g).   Placenta status: delivered,spontaneously .  Cord: 3vv,  with the following complications: nuchal, not tight but not able to reduce easily. Attempt to cut cord but baby delivered quickly and reduced with delivery.  Anesthesia:  epidural Episiotomy:  none Lacerations:  small periurethral Suture Repair: 3.0 vicryl Est. Blood Loss (mL):   Mom to postpartum.  Baby to Couplet care / Skin to Skin.  Vick Frees 08/04/2023, 4:11 PM

## 2023-04-02 LAB — OB RESULTS CONSOLE GC/CHLAMYDIA
Chlamydia: POSITIVE
Neisseria Gonorrhea: NEGATIVE

## 2023-04-02 LAB — OB RESULTS CONSOLE RUBELLA ANTIBODY, IGM: Rubella: IMMUNE

## 2023-04-02 LAB — OB RESULTS CONSOLE HIV ANTIBODY (ROUTINE TESTING): HIV: NONREACTIVE

## 2023-04-02 LAB — HEPATITIS C ANTIBODY: HCV Ab: NEGATIVE

## 2023-04-02 LAB — OB RESULTS CONSOLE HEPATITIS B SURFACE ANTIGEN: Hepatitis B Surface Ag: NEGATIVE

## 2023-07-16 LAB — OB RESULTS CONSOLE GC/CHLAMYDIA
Chlamydia: NEGATIVE
Neisseria Gonorrhea: NEGATIVE

## 2023-07-16 LAB — OB RESULTS CONSOLE GBS: GBS: NEGATIVE

## 2023-08-04 ENCOUNTER — Inpatient Hospital Stay (HOSPITAL_COMMUNITY): Payer: BC Managed Care – PPO | Admitting: Anesthesiology

## 2023-08-04 ENCOUNTER — Encounter (HOSPITAL_COMMUNITY): Payer: Self-pay

## 2023-08-04 ENCOUNTER — Other Ambulatory Visit: Payer: Self-pay

## 2023-08-04 ENCOUNTER — Inpatient Hospital Stay (HOSPITAL_COMMUNITY)
Admission: AD | Admit: 2023-08-04 | Discharge: 2023-08-06 | DRG: 806 | Disposition: A | Discharge status: Home / Self Care | Payer: BC Managed Care – PPO | Attending: Obstetrics and Gynecology | Admitting: Obstetrics and Gynecology

## 2023-08-04 DIAGNOSIS — O9832 Other infections with a predominantly sexual mode of transmission complicating childbirth: Secondary | ICD-10-CM | POA: Diagnosis present

## 2023-08-04 DIAGNOSIS — A6000 Herpesviral infection of urogenital system, unspecified: Secondary | ICD-10-CM | POA: Diagnosis present

## 2023-08-04 DIAGNOSIS — D573 Sickle-cell trait: Secondary | ICD-10-CM | POA: Diagnosis present

## 2023-08-04 DIAGNOSIS — O43193 Other malformation of placenta, third trimester: Secondary | ICD-10-CM | POA: Diagnosis present

## 2023-08-04 DIAGNOSIS — Z3A39 39 weeks gestation of pregnancy: Secondary | ICD-10-CM | POA: Diagnosis not present

## 2023-08-04 DIAGNOSIS — O26893 Other specified pregnancy related conditions, third trimester: Secondary | ICD-10-CM | POA: Diagnosis present

## 2023-08-04 LAB — CBC
HCT: 32.3 % — ABNORMAL LOW (ref 36.0–46.0)
Hemoglobin: 10.7 g/dL — ABNORMAL LOW (ref 12.0–15.0)
MCH: 26.2 pg (ref 26.0–34.0)
MCHC: 33.1 g/dL (ref 30.0–36.0)
MCV: 79 fL — ABNORMAL LOW (ref 80.0–100.0)
Platelets: 251 10*3/uL (ref 150–400)
RBC: 4.09 MIL/uL (ref 3.87–5.11)
RDW: 12.4 % (ref 11.5–15.5)
WBC: 7.4 10*3/uL (ref 4.0–10.5)
nRBC: 0 % (ref 0.0–0.2)

## 2023-08-04 LAB — TYPE AND SCREEN
ABO/RH(D): O POS
Antibody Screen: NEGATIVE

## 2023-08-04 MED ORDER — TETANUS-DIPHTH-ACELL PERTUSSIS 5-2.5-18.5 LF-MCG/0.5 IM SUSY: 0.5000 mL | PREFILLED_SYRINGE | Freq: Once | INTRAMUSCULAR | Status: DC

## 2023-08-04 MED ORDER — PHENYLEPHRINE 80 MCG/ML (10ML) SYRINGE FOR IV PUSH (FOR BLOOD PRESSURE SUPPORT): 80.0000 ug | PREFILLED_SYRINGE | INTRAVENOUS | Status: DC | PRN

## 2023-08-04 MED ORDER — FENTANYL-BUPIVACAINE-NACL 0.5-0.125-0.9 MG/250ML-% EP SOLN
EPIDURAL | Status: AC
  Filled 2023-08-04: qty 250

## 2023-08-04 MED ORDER — OXYCODONE-ACETAMINOPHEN 5-325 MG PO TABS: 1.0000 | ORAL_TABLET | ORAL | Status: DC | PRN

## 2023-08-04 MED ORDER — IBUPROFEN 600 MG PO TABS
600.0000 mg | ORAL_TABLET | Freq: Four times a day (QID) | ORAL | Status: DC
  Administered 2023-08-04: 600 mg via ORAL
  Filled 2023-08-04 (×2): qty 1

## 2023-08-04 MED ORDER — OXYTOCIN BOLUS FROM INFUSION
333.0000 mL | Freq: Once | INTRAVENOUS | Status: AC
  Administered 2023-08-04: 333 mL via INTRAVENOUS

## 2023-08-04 MED ORDER — FENTANYL-BUPIVACAINE-NACL 0.5-0.125-0.9 MG/250ML-% EP SOLN
EPIDURAL | Status: DC | PRN
  Administered 2023-08-04: 12 mL/h via EPIDURAL

## 2023-08-04 MED ORDER — EPHEDRINE 5 MG/ML INJ: 10.0000 mg | INTRAVENOUS | Status: DC | PRN

## 2023-08-04 MED ORDER — DIPHENHYDRAMINE HCL 25 MG PO CAPS: 25.0000 mg | ORAL_CAPSULE | Freq: Four times a day (QID) | ORAL | Status: DC | PRN

## 2023-08-04 MED ORDER — ONDANSETRON HCL 4 MG/2ML IJ SOLN: 4.0000 mg | INTRAMUSCULAR | Status: DC | PRN

## 2023-08-04 MED ORDER — LIDOCAINE HCL (PF) 1 % IJ SOLN
INTRAMUSCULAR | Status: DC | PRN
  Administered 2023-08-04: 10 mL via EPIDURAL
  Administered 2023-08-04: 2 mL via EPIDURAL

## 2023-08-04 MED ORDER — ONDANSETRON HCL 4 MG PO TABS: 4.0000 mg | ORAL_TABLET | ORAL | Status: DC | PRN

## 2023-08-04 MED ORDER — BENZOCAINE-MENTHOL 20-0.5 % EX AERO: 1.0000 | INHALATION_SPRAY | CUTANEOUS | Status: DC | PRN

## 2023-08-04 MED ORDER — COCONUT OIL OIL: 1.0000 | TOPICAL_OIL | Status: DC | PRN

## 2023-08-04 MED ORDER — SOD CITRATE-CITRIC ACID 500-334 MG/5ML PO SOLN
30.0000 mL | ORAL | Status: DC | PRN
Start: 2023-08-04 — End: 2023-08-04

## 2023-08-04 MED ORDER — LACTATED RINGERS IV SOLN: 500.0000 mL | INTRAVENOUS | Status: DC | PRN

## 2023-08-04 MED ORDER — DIBUCAINE (PERIANAL) 1 % EX OINT: 1.0000 | TOPICAL_OINTMENT | CUTANEOUS | Status: DC | PRN

## 2023-08-04 MED ORDER — SIMETHICONE 80 MG PO CHEW: 80.0000 mg | CHEWABLE_TABLET | ORAL | Status: DC | PRN

## 2023-08-04 MED ORDER — LACTATED RINGERS IV SOLN
500.0000 mL | Freq: Once | INTRAVENOUS | Status: AC
  Administered 2023-08-04: 500 mL via INTRAVENOUS

## 2023-08-04 MED ORDER — ACETAMINOPHEN 325 MG PO TABS
650.0000 mg | ORAL_TABLET | ORAL | Status: DC | PRN
  Administered 2023-08-05 – 2023-08-06 (×4): 650 mg via ORAL
  Filled 2023-08-04 (×4): qty 2

## 2023-08-04 MED ORDER — SENNOSIDES-DOCUSATE SODIUM 8.6-50 MG PO TABS
2.0000 | ORAL_TABLET | Freq: Every day | ORAL | Status: DC
  Filled 2023-08-04 (×2): qty 2

## 2023-08-04 MED ORDER — ACETAMINOPHEN 325 MG PO TABS
650.0000 mg | ORAL_TABLET | ORAL | Status: DC | PRN
Start: 2023-08-04 — End: 2023-08-04

## 2023-08-04 MED ORDER — WITCH HAZEL-GLYCERIN EX PADS: 1.0000 | MEDICATED_PAD | CUTANEOUS | Status: DC | PRN

## 2023-08-04 MED ORDER — FENTANYL-BUPIVACAINE-NACL 0.5-0.125-0.9 MG/250ML-% EP SOLN: 12.0000 mL/h | EPIDURAL | Status: DC | PRN

## 2023-08-04 MED ORDER — LIDOCAINE HCL (PF) 1 % IJ SOLN: 30.0000 mL | INTRAMUSCULAR | Status: DC | PRN

## 2023-08-04 MED ORDER — DIPHENHYDRAMINE HCL 50 MG/ML IJ SOLN: 12.5000 mg | INTRAMUSCULAR | Status: DC | PRN

## 2023-08-04 MED ORDER — OXYTOCIN-SODIUM CHLORIDE 30-0.9 UT/500ML-% IV SOLN
2.5000 [IU]/h | INTRAVENOUS | Status: DC
  Administered 2023-08-04: 2.5 [IU]/h via INTRAVENOUS
  Filled 2023-08-04: qty 500

## 2023-08-04 MED ORDER — FLEET ENEMA RE ENEM: 1.0000 | ENEMA | RECTAL | Status: DC | PRN

## 2023-08-04 MED ORDER — ONDANSETRON HCL 4 MG/2ML IJ SOLN: 4.0000 mg | Freq: Four times a day (QID) | INTRAMUSCULAR | Status: DC | PRN

## 2023-08-04 MED ORDER — OXYCODONE-ACETAMINOPHEN 5-325 MG PO TABS: 2.0000 | ORAL_TABLET | ORAL | Status: DC | PRN

## 2023-08-04 MED ORDER — PRENATAL MULTIVITAMIN CH: 1.0000 | ORAL_TABLET | Freq: Every day | ORAL | Status: DC

## 2023-08-04 MED ORDER — LACTATED RINGERS IV SOLN: INTRAVENOUS | Status: DC

## 2023-08-04 NOTE — Anesthesia Procedure Notes (Signed)
Epidural Patient location during procedure: OB Start time: 08/04/2023 1:54 PM End time: 08/04/2023 2:03 PM  Staffing Anesthesiologist: Lannie Fields, DO Performed: anesthesiologist   Preanesthetic Checklist Completed: patient identified, IV checked, risks and benefits discussed, monitors and equipment checked, pre-op evaluation and timeout performed  Epidural Patient position: sitting Prep: DuraPrep and site prepped and draped Patient monitoring: continuous pulse ox, blood pressure, heart rate and cardiac monitor Approach: midline Location: L3-L4 Injection technique: LOR air  Needle:  Needle type: Tuohy  Needle gauge: 17 G Needle length: 9 cm Needle insertion depth: 7 cm Catheter type: closed end flexible Catheter size: 19 Gauge Catheter at skin depth: 12 cm Test dose: negative  Assessment Sensory level: T8 Events: blood not aspirated, no cerebrospinal fluid, injection not painful, no injection resistance, no paresthesia and negative IV test  Additional Notes Patient identified. Risks/Benefits/Options discussed with patient including but not limited to bleeding, infection, nerve damage, paralysis, failed block, incomplete pain control, headache, blood pressure changes, nausea, vomiting, reactions to medication both or allergic, itching and postpartum back pain. Confirmed with bedside nurse the patient's most recent platelet count. Confirmed with patient that they are not currently taking any anticoagulation, have any bleeding history or any family history of bleeding disorders. Patient expressed understanding and wished to proceed. All questions were answered. Sterile technique was used throughout the entire procedure. Please see nursing notes for vital signs. Test dose was given through epidural catheter and negative prior to continuing to dose epidural or start infusion. Warning signs of high block given to the patient including shortness of breath, tingling/numbness in  hands, complete motor block, or any concerning symptoms with instructions to call for help. Patient was given instructions on fall risk and not to get out of bed. All questions and concerns addressed with instructions to call with any issues or inadequate analgesia.  Reason for block:procedure for pain

## 2023-08-04 NOTE — Lactation Note (Signed)
This note was copied from a baby's chart. Lactation Consultation Note Mom chooses to formula feed.  Patient Name: Linda Craig [id number] Date: 08/04/2023 Age:29 hours     Maternal Data    Feeding Nipple Type: Slow - flow  LATCH Score                    Lactation Tools Discussed/Used    Interventions    Discharge    Consult Status Consult Status: Complete    Vinh Sachs, Diamond Nickel 08/04/2023, 7:35 PM

## 2023-08-04 NOTE — MAU Note (Signed)
.  Linda Craig is a 27 y.o. at [redacted]w[redacted]d here in MAU reporting: ctx every 1-2 minutes. Unsure when they started. Denies VB or LOF. +FM.   Pain score: 10 Vitals:   08/04/23 1301  BP: (!) 141/72  Pulse: 66  Resp: 20  Temp: 98.2 F (36.8 C)     FHT:130

## 2023-08-04 NOTE — H&P (Addendum)
Linda Craig is a 27 y.o. G2P1001 at [redacted]w[redacted]d gestation presents for complaint of Contractions and Loss of fluid. No vb. +FM.  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 No pertinent surgical history. Family History: family history includes Ulcers in an other family member. Social History:  reports that she has never smoked. She has never used smokeless tobacco. She reports that she does not currently use alcohol. She reports that she does not use drugs.  ROS: See above otherwise negative  Prenatal labs:  ABO, Rh: --/--/O POS (09/02 1319) Antibody: NEG (09/02 1319) Rubella:  immune RPR:   non reactive HBsAg:   negative HIV:  neg GBS:   negative 1 hr Glucola: Normal Genetic screening:  declined Anatomy US: Normal  Physical Exam:   Dilation: 9 Effacement (%): 90 Station: 0 Exam by:: Otten, RN Blood pressure 124/63, pulse (!) 56, temperature 98.2 F (36.8 C), temperature source Axillary, resp. rate 20, height 5\' 2"  (1.575 m), weight 84.8 kg, SpO2 100%, unknown if currently breastfeeding. A&O x 3 HEENT: Normal Lungs: CTAB CV: RRR Abdominal: Soft, Non-tender, Gravid, and Estimated fetal weight: 6 1/2 lbs  Lower Extremities: Non-edematous, Non-tender  Pelvic Exam:      Dilatation: 10cm     Effacement: 100%     Station: +1     Presentation: Cephalic  Labs:  CBC:  Lab Results  Component Value Date   WBC 7.4 08/04/2023   RBC 4.09 08/04/2023   HGB 10.7 (L) 08/04/2023   HCT 32.3 (L) 08/04/2023   MCV 79.0 (L) 08/04/2023   MCH  26.2 08/04/2023   MCHC 33.1 08/04/2023   RDW 12.4 08/04/2023   PLT 251 08/04/2023    Prenatal Transfer Tool  Maternal Diabetes: No Genetic Screening: Declined Maternal Ultrasounds/Referrals: Normal Fetal Ultrasounds or other Referrals:  None Maternal Substance Abuse:  No Significant Maternal Medications:  None Significant Maternal Lab Results: Group B Strep negative Number of Prenatal Visits:greater than 3 verified prenatal visits Other Comments:   none    Assessment/Plan:  27 y.o. G2P1001 at [redacted]w[redacted]d gestation   Active labor - srom in MAU, clear fluid; plan svd Fetal status reassuring Gbs neg Rh pos RI SS trait Hsv 2; on suppression, no lesions   Vick Frees 08/04/2023, 3:37 PM

## 2023-08-04 NOTE — Anesthesia Preprocedure Evaluation (Signed)
Anesthesia Evaluation  Patient identified by MRN, date of birth, ID band Patient awake    Reviewed: Allergy & Precautions, Patient's Chart, lab work & pertinent test results  Airway Mallampati: II  TM Distance: >3 FB Neck ROM: Full    Dental no notable dental hx.    Pulmonary neg pulmonary ROS   Pulmonary exam normal breath sounds clear to auscultation       Cardiovascular negative cardio ROS Normal cardiovascular exam Rhythm:Regular Rate:Normal     Neuro/Psych negative neurological ROS  negative psych ROS   GI/Hepatic negative GI ROS, Neg liver ROS,,,  Endo/Other  negative endocrine ROS    Renal/GU negative Renal ROS  negative genitourinary   Musculoskeletal negative musculoskeletal ROS (+)    Abdominal   Peds negative pediatric ROS (+)  Hematology  (+) Blood dyscrasia, anemia Hb 10.7, plt 251   Anesthesia Other Findings   Reproductive/Obstetrics (+) Pregnancy                             Anesthesia Physical Anesthesia Plan  ASA: 2  Anesthesia Plan: Epidural   Post-op Pain Management:    Induction:   PONV Risk Score and Plan: 2  Airway Management Planned: Natural Airway  Additional Equipment: None  Intra-op Plan:   Post-operative Plan:   Informed Consent: I have reviewed the patients History and Physical, chart, labs and discussed the procedure including the risks, benefits and alternatives for the proposed anesthesia with the patient or authorized representative who has indicated his/her understanding and acceptance.       Plan Discussed with:   Anesthesia Plan Comments:        Anesthesia Quick Evaluation

## 2023-08-05 LAB — CBC
HCT: 29.6 % — ABNORMAL LOW (ref 36.0–46.0)
Hemoglobin: 10 g/dL — ABNORMAL LOW (ref 12.0–15.0)
MCH: 27 pg (ref 26.0–34.0)
MCHC: 33.8 g/dL (ref 30.0–36.0)
MCV: 80 fL (ref 80.0–100.0)
Platelets: 221 10*3/uL (ref 150–400)
RBC: 3.7 MIL/uL — ABNORMAL LOW (ref 3.87–5.11)
RDW: 12.5 % (ref 11.5–15.5)
WBC: 11.2 10*3/uL — ABNORMAL HIGH (ref 4.0–10.5)
nRBC: 0 % (ref 0.0–0.2)

## 2023-08-05 LAB — RPR: RPR Ser Ql: NONREACTIVE

## 2023-08-05 NOTE — Anesthesia Postprocedure Evaluation (Signed)
Anesthesia Post Note  Patient: Nurse, adult  Procedure(s) Performed: AN AD HOC LABOR EPIDURAL     Patient location during evaluation: Mother Baby Anesthesia Type: Epidural Level of consciousness: awake, oriented and awake and alert Pain management: pain level controlled Vital Signs Assessment: post-procedure vital signs reviewed and stable Respiratory status: spontaneous breathing, respiratory function stable and nonlabored ventilation Cardiovascular status: stable Postop Assessment: no headache, adequate PO intake, able to ambulate, patient able to bend at knees and no apparent nausea or vomiting Anesthetic complications: no   No notable events documented.  Last Vitals:  Vitals:   08/05/23 0411 08/05/23 0822  BP: 137/76 122/77  Pulse: (!) 59 62  Resp: 18 18  Temp: 36.7 C 36.7 C  SpO2: 100% 100%    Last Pain:  Vitals:   08/05/23 0822  TempSrc: Oral  PainSc: 0-No pain   Pain Goal:                Epidural/Spinal Function Cutaneous sensation: Normal sensation (08/05/23 7829), Patient able to flex knees: Yes (08/05/23 5621), Patient able to lift hips off bed: Yes (08/05/23 3086), Back pain beyond tenderness at insertion site: No (08/05/23 0822), Progressively worsening motor and/or sensory loss: No (08/05/23 0822)  Falyn Rubel

## 2023-08-05 NOTE — Discharge Summary (Addendum)
OB Discharge Summary  Patient Name: Linda Craig DOB: Oct 21, 1996 MRN: [id number]  Date of admission: 08/04/2023 Delivering provider: Rhoderick Moody E  Admitting diagnosis: Normal labor and delivery [O80] Intrauterine pregnancy: [redacted]w[redacted]d     Secondary diagnosis: Patient Active Problem List   Diagnosis Date Noted   SVD (spontaneous vaginal delivery) 08/05/2023   Periurethral abrasion, delivered, current hospitalization 08/05/2023   Normal labor and delivery 08/04/2023   Postpartum care following vaginal delivery 9/2 05/14/2022    Date of discharge: 08/05/2023   Discharge diagnosis: Principal Problem:   Normal labor and delivery Active Problems:   Postpartum care following vaginal delivery 9/2   SVD (spontaneous vaginal delivery)   Periurethral abrasion, delivered, current hospitalization                                                           Augmentation: N/A Pain control: Epidural Laceration:Periurethral Complications: None  Hospital course:  Onset of Labor With Vaginal Delivery      27 y.o. yo G2P2002 at [redacted]w[redacted]d was admitted in Active Labor on 08/04/2023.  Membrane Rupture Time/Date: 1:07 PM,08/04/2023  Delivery Method:Vaginal, Spontaneous Operative Delivery:N/A Episiotomy: None Lacerations:  Periurethral Patient had an uncomplicated postpartum course. She is ambulating, tolerating a regular diet, passing flatus, and urinating well. Patient is discharged home in stable condition on 08/05/23.  Newborn Data: Birth date:08/04/2023 Birth time:3:52 PM Gender:Female Living status:Living Apgars:8 ,9  Weight:3220 g  Physical exam  Vitals:   08/04/23 1939 08/04/23 2300 08/05/23 0411 08/05/23 0822  BP: 100/83 135/72 137/76 122/77  Pulse: 62 64 (!) 59 62  Resp: 18 18 18 18   Temp: 98.2 F (36.8 C) 98.3 F (36.8 C) 98.1 F (36.7 C) 98 F (36.7 C)  TempSrc: Oral Oral Oral Oral  SpO2: 100% 100% 100% 100%  Weight:      Height:       General: alert and cooperative Lochia:  appropriate Uterine Fundus: firm Perineum: intact DVT Evaluation: No evidence of DVT seen on physical exam.  Labs: Lab Results  Component Value Date   WBC 11.2 (H) 08/05/2023   HGB 10.0 (L) 08/05/2023   HCT 29.6 (L) 08/05/2023   MCV 80.0 08/05/2023   PLT 221 08/05/2023      08/04/2023    6:17 PM 05/22/2022    5:07 PM 05/14/2022   12:33 PM  Edinburgh Postnatal Depression Scale Screening Tool  I have been able to laugh and see the funny side of things. 0 0 0  I have looked forward with enjoyment to things. 0 0 0  I have blamed myself unnecessarily when things went wrong. 0 0 0  I have been anxious or worried for no good reason. 0 0 0  I have felt scared or panicky for no good reason. 0 0 0  Things have been getting on top of me. 0 0 0  I have been so unhappy that I have had difficulty sleeping. 0 0 0  I have felt sad or miserable. 0 0 0  I have been so unhappy that I have been crying. 0 0 0  The thought of harming myself has occurred to me. 0 0 0  Edinburgh Postnatal Depression Scale Total 0 0 0   Discharge instructions:  per After Visit Summary  After Visit Meds:  Allergies as  of 08/05/2023   No Known Allergies      Medication List     STOP taking these medications    ferrous sulfate 325 (65 FE) MG tablet   ibuprofen 600 MG tablet Commonly known as: ADVIL       TAKE these medications    prenatal multivitamin Tabs tablet Take 1 tablet by mouth daily at 12 noon.       Activity: Advance as tolerated. Pelvic rest for 6 weeks.   Newborn Data: Live born female  Birth Weight: 7 lb 1.6 oz (3220 g) APGAR: 8, 9  Newborn Delivery   Birth date/time: 08/04/2023 15:52:00 Delivery type: Vaginal, Spontaneous    Named [name] Baby Feeding: Bottle Disposition:home with mother  Delivery Report:  Review the Delivery Report for details.    Follow up:  Follow-up Information     Law, Cassandra A, DO. Schedule an appointment as soon as possible for a visit in 6  week(s).   Specialty: Obstetrics and Gynecology Contact information: [address] [place] [address] [phone]                June Leap, CNM, MSN 08/05/2023, 11:41 AM      Addendum:  Patient remained inpatient for additional night for newborn concerns.  Status unchanged, discharged home with newborn with above instructions.   Neta Mends, MSN, CNM 08/06/2023, 9:51 AM

## 2023-08-06 ENCOUNTER — Encounter (HOSPITAL_COMMUNITY): Payer: Self-pay | Admitting: Obstetrics and Gynecology

## 2023-09-05 ENCOUNTER — Telehealth (HOSPITAL_COMMUNITY): Payer: Self-pay

## 2023-09-05 NOTE — Telephone Encounter (Signed)
09/05/2023 1920  Name: Ameli Sangiovanni MRN: [id number] DOB: 1996-10-10  Reason for Call:  Transition of Care Hospital Discharge Call  Contact Status: Patient Contact Status: Message  Language assistant needed: Interpreter Mode: Interpreter Not Needed        Follow-Up Questions:    Inocente Salles Postnatal Depression Scale:  In the Past 7 Days:    PHQ2-9 Depression Scale:     Discharge Follow-up:    Post-discharge interventions: NA  Signature  Signe Colt
